# Patient Record
Sex: Female | Born: 1935 | Race: White | Hispanic: No | Smoking: Never smoker
Health system: Southern US, Community
[De-identification: ages and names within clinical notes are randomized; demographics above are authoritative.]

## PROBLEM LIST (undated history)

## (undated) DIAGNOSIS — I219 Acute myocardial infarction, unspecified: Secondary | ICD-10-CM

## (undated) DIAGNOSIS — I10 Essential (primary) hypertension: Secondary | ICD-10-CM

## (undated) DIAGNOSIS — E039 Hypothyroidism, unspecified: Secondary | ICD-10-CM

## (undated) DIAGNOSIS — I639 Cerebral infarction, unspecified: Secondary | ICD-10-CM

## (undated) DIAGNOSIS — E785 Hyperlipidemia, unspecified: Secondary | ICD-10-CM

## (undated) DIAGNOSIS — K449 Diaphragmatic hernia without obstruction or gangrene: Secondary | ICD-10-CM

## (undated) DIAGNOSIS — K219 Gastro-esophageal reflux disease without esophagitis: Secondary | ICD-10-CM

## (undated) HISTORY — PX: CHOLECYSTECTOMY: SHX55

---

## 2015-01-09 ENCOUNTER — Emergency Department (HOSPITAL_COMMUNITY): Payer: Medicare (Managed Care)

## 2015-01-09 ENCOUNTER — Emergency Department (HOSPITAL_COMMUNITY)
Admission: EM | Admit: 2015-01-09 | Discharge: 2015-01-09 | Disposition: A | Discharge status: Home / Self Care | Payer: Medicare (Managed Care) | Attending: Emergency Medicine | Admitting: Emergency Medicine

## 2015-01-09 DIAGNOSIS — R079 Chest pain, unspecified: Secondary | ICD-10-CM | POA: Diagnosis not present

## 2015-01-09 DIAGNOSIS — Z7982 Long term (current) use of aspirin: Secondary | ICD-10-CM | POA: Diagnosis not present

## 2015-01-09 DIAGNOSIS — Z79899 Other long term (current) drug therapy: Secondary | ICD-10-CM | POA: Diagnosis not present

## 2015-01-09 DIAGNOSIS — R531 Weakness: Secondary | ICD-10-CM

## 2015-01-09 DIAGNOSIS — E876 Hypokalemia: Secondary | ICD-10-CM | POA: Insufficient documentation

## 2015-01-09 LAB — URINE MICROSCOPIC-ADD ON

## 2015-01-09 LAB — CBC
HEMATOCRIT: 42 % (ref 36.0–46.0)
Hemoglobin: 13.7 g/dL (ref 12.0–15.0)
MCH: 31.6 pg (ref 26.0–34.0)
MCHC: 32.6 g/dL (ref 30.0–36.0)
MCV: 97 fL (ref 78.0–100.0)
PLATELETS: 221 10*3/uL (ref 150–400)
RBC: 4.33 MIL/uL (ref 3.87–5.11)
RDW: 12.7 % (ref 11.5–15.5)
WBC: 9.1 10*3/uL (ref 4.0–10.5)

## 2015-01-09 LAB — BASIC METABOLIC PANEL
Anion gap: 9 (ref 5–15)
BUN: 12 mg/dL (ref 6–20)
CHLORIDE: 102 mmol/L (ref 101–111)
CO2: 27 mmol/L (ref 22–32)
CREATININE: 0.78 mg/dL (ref 0.44–1.00)
Calcium: 8.8 mg/dL — ABNORMAL LOW (ref 8.9–10.3)
GFR calc non Af Amer: >60 mL/min (ref >60)
Glucose, Bld: 161 mg/dL — ABNORMAL HIGH (ref 65–99)
POTASSIUM: 3 mmol/L — AB (ref 3.5–5.1)
SODIUM: 138 mmol/L (ref 135–145)

## 2015-01-09 LAB — URINALYSIS, ROUTINE W REFLEX MICROSCOPIC
BILIRUBIN URINE: NEGATIVE
Glucose, UA: NEGATIVE mg/dL
Ketones, ur: NEGATIVE mg/dL
Leukocytes, UA: NEGATIVE
NITRITE: NEGATIVE
PROTEIN: NEGATIVE mg/dL
SPECIFIC GRAVITY, URINE: 1.011 (ref 1.005–1.030)
pH: 7 (ref 5.0–8.0)

## 2015-01-09 LAB — I-STAT TROPONIN, ED: Troponin i, poc: 0.01 ng/mL (ref 0.00–0.08)

## 2015-01-09 MED ORDER — POTASSIUM CHLORIDE CRYS ER 20 MEQ PO TBCR: 20.0000 meq | EXTENDED_RELEASE_TABLET | Freq: Every day | ORAL | Status: AC

## 2015-01-09 MED ORDER — POTASSIUM CHLORIDE CRYS ER 20 MEQ PO TBCR
40.0000 meq | EXTENDED_RELEASE_TABLET | Freq: Once | ORAL | Status: AC
  Administered 2015-01-09: 40 meq via ORAL
  Filled 2015-01-09: qty 2

## 2015-01-09 NOTE — ED Notes (Signed)
Pt. Stated, I was at my sons house from [facility] and I was not doing anything and I started having chest pain, left arm pain and some nausea.  This all happened a week ago,  Son brought her in today to make sure she is ok and able to fly back home.

## 2015-01-09 NOTE — ED Provider Notes (Signed)
CSN: [id number]     Arrival date & time 01/09/15  1428 History   First MD Initiated Contact with Patient 01/09/15 1604     Chief Complaint  Patient presents with  . Chest Pain  . Arm Pain  . Nausea     (Consider location/radiation/quality/duration/timing/severity/associated sxs/prior Treatment) HPI   Kristy Watts is a 81 y.o. female is here for evaluation of "heart attack", and fatigue. Her family members with her our concern that she had a heart attack 1 week ago because she had pain in her left chest that radiated to the left arm and left shoulder. The patient can't remember as she was doing at the time, but remembers the pain. Last 45 minutes then resolve spontaneously. She has had fatigue for the last several weeks, during which time she had been traveling both on a cruise, and now to her relatives here in [place]. She lives in [place]. She has a history of "heart failure" and believes that her ejection fraction is 40%. She states that she has never had a stress test or cardiac catheterization. She reports that she is taking her usual medications. She denies cough, shortness of breath, dyspnea on exertion, abdominal pain, back pain, dysuria, urinary frequency, or diarrhea. She has chronic intermittent constipation for which she takes medication. She plans on flying home, in 3 days. There are no other no modifying factors.   No past medical history on file. No past surgical history on file. No family history on file. Social History  Substance Use Topics  . Smoking status: Not on file  . Smokeless tobacco: Not on file  . Alcohol Use: Not on file   OB History    No data available     Review of Systems  All other systems reviewed and are negative.     Allergies  Review of patient's allergies indicates no known allergies.  Home Medications   Prior to Admission medications   Medication Sig Start Date End Date Taking? Authorizing Provider  aspirin EC 81 MG tablet  Take 81 mg by mouth daily.   Yes Historical Provider, MD  atorvastatin (LIPITOR) 10 MG tablet Take 10 mg by mouth daily.   Yes Historical Provider, MD  carvedilol (COREG) 3.125 MG tablet Take 3.125 mg by mouth 2 (two) times daily with a meal.   Yes Historical Provider, MD  dicyclomine (BENTYL) 10 MG capsule Take 10 mg by mouth 4 (four) times daily -  before meals and at bedtime.   Yes Historical Provider, MD  docusate sodium (COLACE) 100 MG capsule Take 100 mg by mouth daily as needed for mild constipation.   Yes Historical Provider, MD  furosemide (LASIX) 40 MG tablet Take 40 mg by mouth daily.   Yes Historical Provider, MD  isosorbide mononitrate (IMDUR) 60 MG 24 hr tablet Take 60 mg by mouth daily.   Yes Historical Provider, MD  levothyroxine (SYNTHROID, LEVOTHROID) 50 MCG tablet Take 50 mcg by mouth daily before breakfast.   Yes Historical Provider, MD  losartan (COZAAR) 25 MG tablet Take 25 mg by mouth daily.   Yes Historical Provider, MD  omeprazole (PRILOSEC) 20 MG capsule Take 20 mg by mouth daily.   Yes Historical Provider, MD  polyethylene glycol (MIRALAX / GLYCOLAX) packet Take 17 g by mouth daily as needed for mild constipation.   Yes Historical Provider, MD  prochlorperazine (COMPAZINE) 10 MG tablet Take 10 mg by mouth every 6 (six) hours as needed for nausea or vomiting.  Yes Historical Provider, MD  simethicone (MYLICON) 80 MG chewable tablet Chew 80 mg by mouth every 6 (six) hours as needed for flatulence.   Yes Historical Provider, MD  temazepam (RESTORIL) 15 MG capsule Take 15 mg by mouth at bedtime as needed for sleep.   Yes Historical Provider, MD  traMADol (ULTRAM) 50 MG tablet Take 50 mg by mouth every 6 (six) hours as needed for moderate pain.   Yes Historical Provider, MD  Valerian 500 MG CAPS Take 500 mg by mouth at bedtime.   Yes Historical Provider, MD  potassium chloride SA (K-DUR,KLOR-CON) 20 MEQ tablet Take 1 tablet (20 mEq total) by mouth daily. 01/09/15   Mancel Bale, MD   BP 124/65 mmHg  Pulse 58  Temp(Src) 98.2 F (36.8 C) (Oral)  Resp 11  Ht  (1.473 m)  Wt 142 lb 8 oz (64.638 kg)  BMI 29.79 kg/m2  SpO2 96% Physical Exam  Constitutional: She is oriented to person, place, and time. She appears well-developed. No distress.  Elderly, frail  HENT:  Head: Normocephalic and atraumatic.  Right Ear: External ear normal.  Left Ear: External ear normal.  Eyes: Conjunctivae and EOM are normal. Pupils are equal, round, and reactive to light.  Neck: Normal range of motion and phonation normal. Neck supple.  Cardiovascular: Normal rate, regular rhythm and normal heart sounds.   Pulmonary/Chest: Effort normal and breath sounds normal. She exhibits no bony tenderness.  Abdominal: Soft. There is no tenderness.  Musculoskeletal: Normal range of motion.  Neurological: She is alert and oriented to person, place, and time. No cranial nerve deficit or sensory deficit. She exhibits normal muscle tone. Coordination normal.  Skin: Skin is warm, dry and intact.  Psychiatric: She has a normal mood and affect. Her behavior is normal. Judgment and thought content normal.  Nursing note and vitals reviewed.   ED Course  Procedures (including critical care time)  Medications  potassium chloride SA (K-DUR,KLOR-CON) CR tablet 40 mEq (40 mEq Oral Given 01/09/15 1619)    Patient Vitals for the past 24 hrs:  BP Temp Temp src Pulse Resp SpO2 Height Weight  01/09/15 1730 124/65 mmHg - - (!) 58 11 96 % - -  01/09/15 1715 (!) 109/48 mmHg - - 61 21 93 % - -  01/09/15 1700 108/63 mmHg - - (!) 54 12 94 % - -  01/09/15 1645 108/72 mmHg - - 61 20 95 % - -  01/09/15 1447 104/71 mmHg 98.2 F (36.8 C) Oral 66 17 96 %  (1.473 m) 142 lb 8 oz (64.638 kg)    5:56 PM Reevaluation with update and discussion. After initial assessment and treatment, an updated evaluation reveals she remains comfortable, has no additional complaints. Findings discussed with patient and  family members, all questions were answered. [name] L    Labs Review Labs Reviewed  BASIC METABOLIC PANEL - Abnormal; Notable for the following:    Potassium 3.0 (*)    Glucose, Bld 161 (*)    Calcium 8.8 (*)    All other components within normal limits  URINALYSIS, ROUTINE W REFLEX MICROSCOPIC (NOT AT [facility]) - Abnormal; Notable for the following:    APPearance CLOUDY (*)    Hgb urine dipstick TRACE (*)    All other components within normal limits  URINE MICROSCOPIC-ADD ON - Abnormal; Notable for the following:    Squamous Epithelial / LPF 0-5 (*)    Bacteria, UA FEW (*)    Casts HYALINE  CASTS (*)    All other components within normal limits  URINE CULTURE  CBC  I-STAT TROPOININ, ED    Imaging Review Dg Chest 2 View  01/09/2015  CLINICAL DATA:  Chest pain EXAM: CHEST  2 VIEW COMPARISON:  None. FINDINGS: Large hiatal hernia, likely with intrathoracic stomach. There is no evidence of pneumonia or edema. No effusion or pneumothorax. Large lung volumes, potential COPD if smoking history. No definitive cardiomegaly when accounting for hernia. Negative aortic contours. IMPRESSION: 1. No acute finding. 2. Large hiatal hernia. Electronically Signed   By: Marnee Spring M.D.   On: 01/09/2015 16:14   I have personally reviewed and evaluated these images and lab results as part of my medical decision-making.   EKG Interpretation   Date/Time:  Saturday January 09 2015 14:35:54 EST Ventricular Rate:  65 PR Interval:  184 QRS Duration: 82 QT Interval:  384 QTC Calculation: 399 R Axis:   -59 Text Interpretation:  Normal sinus rhythm Possible Left atrial enlargement  Inferior infarct , age undetermined Abnormal ECG No old tracing to compare  Confirmed by FLOYD MD, DANIEL [id number]) on 01/09/2015 3:45:33 PM      MDM   Final diagnoses:  Nonspecific chest pain  Weakness  Hypokalemia    Nonspecific chest pain and weakness. Doubt recent MI, acute congestive heart failure, serious  bacterial infection, metabolic instability, or impending vascular collapse.  Nursing Notes Reviewed/ Care Coordinated Applicable Imaging Reviewed Interpretation of Laboratory Data incorporated into ED treatment  The patient appears reasonably screened and/or stabilized for discharge and I doubt any other medical condition or other [facility] requiring further screening, evaluation, or treatment in the ED at this time prior to discharge.  Plan: Home Medications- Kdur x 5 days; Home Treatments- rest; return here if the recommended treatment, does not improve the symptoms; Recommended follow up- PCP 3-4 days for check up and repeat potassium     Mancel Bale, MD 01/09/15 1758

## 2015-01-09 NOTE — ED Notes (Signed)
Pt. In the bathroom. 

## 2015-01-09 NOTE — Discharge Instructions (Signed)
Hypokalemia Hypokalemia means that the amount of potassium in the blood is lower than normal.Potassium is a chemical, called an electrolyte, that helps regulate the amount of fluid in the body. It also stimulates muscle contraction and helps nerves function properly.Most of the body's potassium is inside of cells, and only a very small amount is in the blood. Because the amount in the blood is so small, minor changes can be life-threatening. CAUSES  Antibiotics.  Diarrhea or vomiting.  Using laxatives too much, which can cause diarrhea.  Chronic kidney disease.  Water pills (diuretics).  Eating disorders (bulimia).  Low magnesium level.  Sweating a lot. SIGNS AND SYMPTOMS  Weakness.  Constipation.  Fatigue.  Muscle cramps.  Mental confusion.  Skipped heartbeats or irregular heartbeat (palpitations).  Tingling or numbness. DIAGNOSIS  Your health care provider can diagnose hypokalemia with blood tests. In addition to checking your potassium level, your health care provider may also check other lab tests. TREATMENT Hypokalemia can be treated with potassium supplements taken by mouth or adjustments in your current medicines. If your potassium level is very low, you may need to get potassium through a vein (IV) and be monitored in the hospital. A diet high in potassium is also helpful. Foods high in potassium are:  Nuts, such as peanuts and pistachios.  Seeds, such as sunflower seeds and pumpkin seeds.  Peas, lentils, and lima beans.  Whole grain and bran cereals and breads.  Fresh fruit and vegetables, such as apricots, avocado, bananas, cantaloupe, kiwi, oranges, tomatoes, asparagus, and potatoes.  Orange and tomato juices.  Red meats.  Fruit yogurt. HOME CARE INSTRUCTIONS  Take all medicines as prescribed by your health care provider.  Maintain a healthy diet by including nutritious food, such as fruits, vegetables, nuts, whole grains, and lean meats.  If  you are taking a laxative, be sure to follow the directions on the label. SEEK MEDICAL CARE IF:  Your weakness gets worse.  You feel your heart pounding or racing.  You are vomiting or having diarrhea.  You are diabetic and having trouble keeping your blood glucose in the normal range. SEEK IMMEDIATE MEDICAL CARE IF:  You have chest pain, shortness of breath, or dizziness.  You are vomiting or having diarrhea for more than 2 days.  You faint. MAKE SURE YOU:   Understand these instructions.  Will watch your condition.  Will get help right away if you are not doing well or get worse.   This information is not intended to replace advice given to you by your health care provider. Make sure you discuss any questions you have with your health care provider.   Document Released: 01/23/2005 Document Revised: 02/13/2014 Document Reviewed: 07/26/2012 Elsevier Interactive Patient Education 2016 [employer].  Fatigue Fatigue is feeling tired all of the time, a lack of energy, or a lack of motivation. Occasional or mild fatigue is often a normal response to activity or life in general. However, long-lasting (chronic) or extreme fatigue may indicate an underlying medical condition. HOME CARE INSTRUCTIONS  Watch your fatigue for any changes. The following actions may help to lessen any discomfort you are feeling:  Talk to your health care provider about how much sleep you need each night. Try to get the required amount every night.  Take medicines only as directed by your health care provider.  Eat a healthy and nutritious diet. Ask your health care provider if you need help changing your diet.  Drink enough fluid to keep your urine  clear or pale yellow.  Practice ways of relaxing, such as yoga, meditation, massage therapy, or acupuncture.  Exercise regularly.   Change situations that cause you stress. Try to keep your work and personal routine reasonable.  Do not abuse illegal  drugs.  Limit alcohol intake to no more than 1 drink per day for nonpregnant women and 2 drinks per day for men. One drink equals 12 ounces of beer, 5 ounces of wine, or 1 ounces of hard liquor.  Take a multivitamin, if directed by your health care provider. SEEK MEDICAL CARE IF:   Your fatigue does not get better.  You have a fever.   You have unintentional weight loss or gain.  You have headaches.   You have difficulty:   Falling asleep.  Sleeping throughout the night.  You feel angry, guilty, anxious, or sad.   You are unable to have a bowel movement (constipation).   You skin is dry.   Your legs or another part of your body is swollen.  SEEK IMMEDIATE MEDICAL CARE IF:   You feel confused.   Your vision is blurry.  You feel faint or pass out.   You have a severe headache.   You have severe abdominal, pelvic, or back pain.   You have chest pain, shortness of breath, or an irregular or fast heartbeat.   You are unable to urinate or you urinate less than normal.   You develop abnormal bleeding, such as bleeding from the rectum, vagina, nose, lungs, or nipples.  You vomit blood.   You have thoughts about harming yourself or committing suicide.   You are worried that you might harm someone else.    This information is not intended to replace advice given to you by your health care provider. Make sure you discuss any questions you have with your health care provider.   Document Released: 11/20/2006 Document Revised: 02/13/2014 Document Reviewed: 05/27/2013 Elsevier Interactive Patient Education 2016 [employer]  Nonspecific Chest Pain  Chest pain can be caused by many different conditions. There is always a chance that your pain could be related to something serious, such as a heart attack or a blood clot in your lungs. Chest pain can also be caused by conditions that are not life-threatening. If you have chest pain, it is very important  to follow up with your health care provider. CAUSES  Chest pain can be caused by:  Heartburn.  Pneumonia or bronchitis.  Anxiety or stress.  Inflammation around your heart (pericarditis) or lung (pleuritis or pleurisy).  A blood clot in your lung.  A collapsed lung (pneumothorax). It can develop suddenly on its own (spontaneous pneumothorax) or from trauma to the chest.  Shingles infection (varicella-zoster virus).  Heart attack.  Damage to the bones, muscles, and cartilage that make up your chest wall. This can include:  Bruised bones due to injury.  Strained muscles or cartilage due to frequent or repeated coughing or overwork.  Fracture to one or more ribs.  Sore cartilage due to inflammation (costochondritis). RISK FACTORS  Risk factors for chest pain may include:  Activities that increase your risk for trauma or injury to your chest.  Respiratory infections or conditions that cause frequent coughing.  Medical conditions or overeating that can cause heartburn.  Heart disease or family history of heart disease.  Conditions or health behaviors that increase your risk of developing a blood clot.  Having had chicken pox (varicella zoster). SIGNS AND SYMPTOMS Chest pain can feel like:  Burning or tingling on the surface of your chest or deep in your chest.  Crushing, pressure, aching, or squeezing pain.  Dull or sharp pain that is worse when you move, cough, or take a deep breath.  Pain that is also felt in your back, neck, shoulder, or arm, or pain that spreads to any of these areas. Your chest pain may come and go, or it may stay constant. DIAGNOSIS Lab tests or other studies may be needed to find the cause of your pain. Your health care provider may have you take a test called an ambulatory ECG (electrocardiogram). An ECG records your heartbeat patterns at the time the test is performed. You may also have other tests, such as:  Transthoracic echocardiogram  (TTE). During echocardiography, sound waves are used to create a picture of all of the heart structures and to look at how blood flows through your heart.  Transesophageal echocardiogram (TEE).This is a more advanced imaging test that obtains images from inside your body. It allows your health care provider to see your heart in finer detail.  Cardiac monitoring. This allows your health care provider to monitor your heart rate and rhythm in real time.  Holter monitor. This is a portable device that records your heartbeat and can help to diagnose abnormal heartbeats. It allows your health care provider to track your heart activity for several days, if needed.  Stress tests. These can be done through exercise or by taking medicine that makes your heart beat more quickly.  Blood tests.  Imaging tests. TREATMENT  Your treatment depends on what is causing your chest pain. Treatment may include:  Medicines. These may include:  Acid blockers for heartburn.  Anti-inflammatory medicine.  Pain medicine for inflammatory conditions.  Antibiotic medicine, if an infection is present.  Medicines to dissolve blood clots.  Medicines to treat coronary artery disease.  Supportive care for conditions that do not require medicines. This may include:  Resting.  Applying heat or cold packs to injured areas.  Limiting activities until pain decreases. HOME CARE INSTRUCTIONS  If you were prescribed an antibiotic medicine, finish it all even if you start to feel better.  Avoid any activities that bring on chest pain.  Do not use any tobacco products, including cigarettes, chewing tobacco, or electronic cigarettes. If you need help quitting, ask your health care provider.  Do not drink alcohol.  Take medicines only as directed by your health care provider.  Keep all follow-up visits as directed by your health care provider. This is important. This includes any further testing if your chest pain  does not go away.  If heartburn is the cause for your chest pain, you may be told to keep your head raised (elevated) while sleeping. This reduces the chance that acid will go from your stomach into your esophagus.  Make lifestyle changes as directed by your health care provider. These may include:  Getting regular exercise. Ask your health care provider to suggest some activities that are safe for you.  Eating a heart-healthy diet. A registered dietitian can help you to learn healthy eating options.  Maintaining a healthy weight.  Managing diabetes, if necessary.  Reducing stress. SEEK MEDICAL CARE IF:  Your chest pain does not go away after treatment.  You have a rash with blisters on your chest.  You have a fever. SEEK IMMEDIATE MEDICAL CARE IF:   Your chest pain is worse.  You have an increasing cough, or you cough up blood.  You  have severe abdominal pain.  You have severe weakness.  You faint.  You have chills.  You have sudden, unexplained chest discomfort.  You have sudden, unexplained discomfort in your arms, back, neck, or jaw.  You have shortness of breath at any time.  You suddenly start to sweat, or your skin gets clammy.  You feel nauseous or you vomit.  You suddenly feel light-headed or dizzy.  Your heart begins to beat quickly, or it feels like it is skipping beats. These symptoms may represent a serious problem that is an emergency. Do not wait to see if the symptoms will go away. Get medical help right away. Call your local emergency services (911 in the [place] Do not drive yourself to the hospital.   This information is not intended to replace advice given to you by your health care provider. Make sure you discuss any questions you have with your health care provider.   Document Released: 11/02/2004 Document Revised: 02/13/2014 Document Reviewed: 08/29/2013 Elsevier Interactive Patient Education [employer]

## 2015-01-11 LAB — URINE CULTURE: Special Requests: NORMAL

## 2015-05-24 ENCOUNTER — Inpatient Hospital Stay (HOSPITAL_COMMUNITY)
Admission: EM | Admit: 2015-05-24 | Discharge: 2015-05-26 | DRG: 689 | Disposition: A | Discharge status: Home / Self Care | Payer: Medicare (Managed Care) | Attending: Internal Medicine | Admitting: Internal Medicine

## 2015-05-24 ENCOUNTER — Emergency Department (HOSPITAL_COMMUNITY): Payer: Medicare (Managed Care)

## 2015-05-24 ENCOUNTER — Encounter (HOSPITAL_COMMUNITY): Payer: Self-pay | Admitting: Radiology

## 2015-05-24 DIAGNOSIS — I219 Acute myocardial infarction, unspecified: Secondary | ICD-10-CM | POA: Diagnosis present

## 2015-05-24 DIAGNOSIS — R2990 Unspecified symptoms and signs involving the nervous system: Secondary | ICD-10-CM

## 2015-05-24 DIAGNOSIS — E785 Hyperlipidemia, unspecified: Secondary | ICD-10-CM | POA: Diagnosis present

## 2015-05-24 DIAGNOSIS — G92 Toxic encephalopathy: Secondary | ICD-10-CM | POA: Diagnosis present

## 2015-05-24 DIAGNOSIS — R42 Dizziness and giddiness: Secondary | ICD-10-CM | POA: Diagnosis not present

## 2015-05-24 DIAGNOSIS — R4781 Slurred speech: Secondary | ICD-10-CM | POA: Diagnosis not present

## 2015-05-24 DIAGNOSIS — I252 Old myocardial infarction: Secondary | ICD-10-CM

## 2015-05-24 DIAGNOSIS — N390 Urinary tract infection, site not specified: Principal | ICD-10-CM | POA: Diagnosis present

## 2015-05-24 DIAGNOSIS — R531 Weakness: Secondary | ICD-10-CM

## 2015-05-24 DIAGNOSIS — E039 Hypothyroidism, unspecified: Secondary | ICD-10-CM | POA: Diagnosis present

## 2015-05-24 DIAGNOSIS — K219 Gastro-esophageal reflux disease without esophagitis: Secondary | ICD-10-CM | POA: Diagnosis present

## 2015-05-24 DIAGNOSIS — I2510 Atherosclerotic heart disease of native coronary artery without angina pectoris: Secondary | ICD-10-CM | POA: Diagnosis present

## 2015-05-24 DIAGNOSIS — R410 Disorientation, unspecified: Secondary | ICD-10-CM

## 2015-05-24 DIAGNOSIS — R5383 Other fatigue: Secondary | ICD-10-CM | POA: Diagnosis not present

## 2015-05-24 DIAGNOSIS — G9340 Encephalopathy, unspecified: Secondary | ICD-10-CM

## 2015-05-24 DIAGNOSIS — Z23 Encounter for immunization: Secondary | ICD-10-CM

## 2015-05-24 DIAGNOSIS — I639 Cerebral infarction, unspecified: Secondary | ICD-10-CM | POA: Diagnosis present

## 2015-05-24 DIAGNOSIS — Z7982 Long term (current) use of aspirin: Secondary | ICD-10-CM

## 2015-05-24 DIAGNOSIS — I10 Essential (primary) hypertension: Secondary | ICD-10-CM | POA: Diagnosis present

## 2015-05-24 DIAGNOSIS — J322 Chronic ethmoidal sinusitis: Secondary | ICD-10-CM | POA: Diagnosis present

## 2015-05-24 HISTORY — DX: Hypothyroidism, unspecified: E03.9

## 2015-05-24 HISTORY — DX: Diaphragmatic hernia without obstruction or gangrene: K44.9

## 2015-05-24 HISTORY — DX: Essential (primary) hypertension: I10

## 2015-05-24 HISTORY — DX: Hyperlipidemia, unspecified: E78.5

## 2015-05-24 HISTORY — DX: Cerebral infarction, unspecified: I63.9

## 2015-05-24 HISTORY — DX: Gastro-esophageal reflux disease without esophagitis: K21.9

## 2015-05-24 HISTORY — DX: Acute myocardial infarction, unspecified: I21.9

## 2015-05-24 LAB — COMPREHENSIVE METABOLIC PANEL
ALK PHOS: 67 U/L (ref 38–126)
ALT: 11 U/L — AB (ref 14–54)
AST: 20 U/L (ref 15–41)
Albumin: 3.3 g/dL — ABNORMAL LOW (ref 3.5–5.0)
Anion gap: 12 (ref 5–15)
BUN: 14 mg/dL (ref 6–20)
CALCIUM: 8.6 mg/dL — AB (ref 8.9–10.3)
CHLORIDE: 106 mmol/L (ref 101–111)
CO2: 23 mmol/L (ref 22–32)
CREATININE: 0.9 mg/dL (ref 0.44–1.00)
GFR, EST NON AFRICAN AMERICAN: 56 mL/min — AB (ref >60)
Glucose, Bld: 98 mg/dL (ref 65–99)
Potassium: 3.8 mmol/L (ref 3.5–5.1)
SODIUM: 141 mmol/L (ref 135–145)
Total Bilirubin: 1 mg/dL (ref 0.3–1.2)
Total Protein: 6.6 g/dL (ref 6.5–8.1)

## 2015-05-24 LAB — URINALYSIS, ROUTINE W REFLEX MICROSCOPIC
BILIRUBIN URINE: NEGATIVE
Glucose, UA: NEGATIVE mg/dL
KETONES UR: 15 mg/dL — AB
Nitrite: NEGATIVE
PROTEIN: NEGATIVE mg/dL
Specific Gravity, Urine: 1.015 (ref 1.005–1.030)
pH: 7 (ref 5.0–8.0)

## 2015-05-24 LAB — DIFFERENTIAL
BASOS ABS: 0 10*3/uL (ref 0.0–0.1)
BASOS PCT: 0 %
Eosinophils Absolute: 0.1 10*3/uL (ref 0.0–0.7)
Eosinophils Relative: 2 %
LYMPHS PCT: 26 %
Lymphs Abs: 1.2 10*3/uL (ref 0.7–4.0)
MONO ABS: 0.9 10*3/uL (ref 0.1–1.0)
MONOS PCT: 19 %
NEUTROS ABS: 2.4 10*3/uL (ref 1.7–7.7)
Neutrophils Relative %: 53 %

## 2015-05-24 LAB — I-STAT CHEM 8, ED
BUN: 17 mg/dL (ref 6–20)
CALCIUM ION: 1.07 mmol/L — AB (ref 1.13–1.30)
CHLORIDE: 103 mmol/L (ref 101–111)
CREATININE: 0.7 mg/dL (ref 0.44–1.00)
GLUCOSE: 95 mg/dL (ref 65–99)
HCT: 41 % (ref 36.0–46.0)
Hemoglobin: 13.9 g/dL (ref 12.0–15.0)
POTASSIUM: 3.7 mmol/L (ref 3.5–5.1)
Sodium: 142 mmol/L (ref 135–145)
TCO2: 25 mmol/L (ref 0–100)

## 2015-05-24 LAB — CBC
HEMATOCRIT: 38.6 % (ref 36.0–46.0)
HEMOGLOBIN: 12.1 g/dL (ref 12.0–15.0)
MCH: 30.9 pg (ref 26.0–34.0)
MCHC: 31.3 g/dL (ref 30.0–36.0)
MCV: 98.7 fL (ref 78.0–100.0)
Platelets: 190 10*3/uL (ref 150–400)
RBC: 3.91 MIL/uL (ref 3.87–5.11)
RDW: 13.1 % (ref 11.5–15.5)
WBC: 4.6 10*3/uL (ref 4.0–10.5)

## 2015-05-24 LAB — RAPID URINE DRUG SCREEN, HOSP PERFORMED
AMPHETAMINES: NOT DETECTED
BARBITURATES: NOT DETECTED
BENZODIAZEPINES: NOT DETECTED
Cocaine: NOT DETECTED
Opiates: POSITIVE — AB
Tetrahydrocannabinol: NOT DETECTED

## 2015-05-24 LAB — I-STAT VENOUS BLOOD GAS, ED
ACID-BASE EXCESS: 1 mmol/L (ref 0.0–2.0)
Bicarbonate: 27.2 mEq/L — ABNORMAL HIGH (ref 20.0–24.0)
O2 Saturation: 73 %
PH VEN: 7.361 — AB (ref 7.250–7.300)
TCO2: 29 mmol/L (ref 0–100)
pCO2, Ven: 48.1 mmHg (ref 45.0–50.0)
pO2, Ven: 40 mmHg (ref 31.0–45.0)

## 2015-05-24 LAB — I-STAT TROPONIN, ED: Troponin i, poc: 0 ng/mL (ref 0.00–0.08)

## 2015-05-24 LAB — URINE MICROSCOPIC-ADD ON

## 2015-05-24 LAB — PROTIME-INR
INR: 1.14 (ref 0.00–1.49)
Prothrombin Time: 14.8 seconds (ref 11.6–15.2)

## 2015-05-24 LAB — ETHANOL

## 2015-05-24 LAB — APTT: APTT: 36 s (ref 24–37)

## 2015-05-24 LAB — CK: CK TOTAL: 59 U/L (ref 38–234)

## 2015-05-24 LAB — TROPONIN I

## 2015-05-24 MED ORDER — DEXTROSE 5 % IV SOLN: 1.0000 g | INTRAVENOUS | Status: DC

## 2015-05-24 MED ORDER — GABAPENTIN 100 MG PO CAPS
100.0000 mg | ORAL_CAPSULE | Freq: Every day | ORAL | Status: DC
  Administered 2015-05-25 – 2015-05-26 (×2): 100 mg via ORAL
  Filled 2015-05-24 (×2): qty 1

## 2015-05-24 MED ORDER — VALERIAN 500 MG PO CAPS: 500.0000 mg | ORAL_CAPSULE | Freq: Every day | ORAL | Status: DC

## 2015-05-24 MED ORDER — TRAZODONE HCL 50 MG PO TABS
50.0000 mg | ORAL_TABLET | Freq: Every day | ORAL | Status: DC
  Administered 2015-05-24 – 2015-05-25 (×2): 50 mg via ORAL
  Filled 2015-05-24 (×2): qty 1

## 2015-05-24 MED ORDER — GABAPENTIN 100 MG PO CAPS
200.0000 mg | ORAL_CAPSULE | Freq: Every day | ORAL | Status: DC
  Administered 2015-05-24 – 2015-05-25 (×2): 200 mg via ORAL
  Filled 2015-05-24 (×2): qty 2

## 2015-05-24 MED ORDER — SODIUM CHLORIDE 0.9 % IV BOLUS (SEPSIS)
1000.0000 mL | Freq: Once | INTRAVENOUS | Status: AC
Start: 2015-05-24 — End: 2015-05-24
  Administered 2015-05-24: 1000 mL via INTRAVENOUS

## 2015-05-24 MED ORDER — ACETAMINOPHEN 325 MG PO TABS
650.0000 mg | ORAL_TABLET | Freq: Four times a day (QID) | ORAL | Status: DC | PRN
  Administered 2015-05-25 – 2015-05-26 (×5): 650 mg via ORAL
  Filled 2015-05-24 (×5): qty 2

## 2015-05-24 MED ORDER — ISOSORBIDE MONONITRATE ER 60 MG PO TB24
60.0000 mg | ORAL_TABLET | Freq: Every day | ORAL | Status: DC
  Administered 2015-05-25 – 2015-05-26 (×2): 60 mg via ORAL
  Filled 2015-05-24 (×2): qty 1

## 2015-05-24 MED ORDER — HYDRALAZINE HCL 20 MG/ML IJ SOLN: 5.0000 mg | INTRAMUSCULAR | Status: DC | PRN

## 2015-05-24 MED ORDER — SODIUM CHLORIDE 0.9 % IV SOLN
INTRAVENOUS | Status: DC
  Administered 2015-05-24 – 2015-05-25 (×3): via INTRAVENOUS

## 2015-05-24 MED ORDER — SODIUM CHLORIDE 0.9 % IV BOLUS (SEPSIS)
1000.0000 mL | Freq: Once | INTRAVENOUS | Status: AC
  Administered 2015-05-24: 1000 mL via INTRAVENOUS

## 2015-05-24 MED ORDER — ISOSORBIDE MONONITRATE ER 60 MG PO TB24: 60.0000 mg | ORAL_TABLET | Freq: Every day | ORAL | Status: DC

## 2015-05-24 MED ORDER — ONDANSETRON HCL 4 MG/2ML IJ SOLN: 4.0000 mg | Freq: Three times a day (TID) | INTRAMUSCULAR | Status: DC | PRN

## 2015-05-24 MED ORDER — SODIUM CHLORIDE 0.9% FLUSH
3.0000 mL | Freq: Two times a day (BID) | INTRAVENOUS | Status: DC
  Administered 2015-05-25: 3 mL via INTRAVENOUS

## 2015-05-24 MED ORDER — TRAZODONE HCL 50 MG PO TABS: 50.0000 mg | ORAL_TABLET | Freq: Every day | ORAL | Status: DC

## 2015-05-24 MED ORDER — DICYCLOMINE HCL 10 MG PO CAPS
10.0000 mg | ORAL_CAPSULE | Freq: Three times a day (TID) | ORAL | Status: DC
  Administered 2015-05-24 – 2015-05-26 (×7): 10 mg via ORAL
  Filled 2015-05-24 (×7): qty 1

## 2015-05-24 MED ORDER — GABAPENTIN 100 MG PO CAPS: 100.0000 mg | ORAL_CAPSULE | ORAL | Status: DC

## 2015-05-24 MED ORDER — PANTOPRAZOLE SODIUM 40 MG PO TBEC
40.0000 mg | DELAYED_RELEASE_TABLET | Freq: Every day | ORAL | Status: DC
  Administered 2015-05-24 – 2015-05-26 (×3): 40 mg via ORAL
  Filled 2015-05-24 (×3): qty 1

## 2015-05-24 MED ORDER — LOSARTAN POTASSIUM 25 MG PO TABS
25.0000 mg | ORAL_TABLET | Freq: Every day | ORAL | Status: DC
  Administered 2015-05-25 – 2015-05-26 (×2): 25 mg via ORAL
  Filled 2015-05-24 (×2): qty 1

## 2015-05-24 MED ORDER — ATORVASTATIN CALCIUM 10 MG PO TABS: 10.0000 mg | ORAL_TABLET | Freq: Every day | ORAL | Status: DC

## 2015-05-24 MED ORDER — ACETAMINOPHEN 650 MG RE SUPP: 650.0000 mg | Freq: Four times a day (QID) | RECTAL | Status: DC | PRN

## 2015-05-24 MED ORDER — CEFTRIAXONE SODIUM 1 G IJ SOLR
1.0000 g | Freq: Once | INTRAMUSCULAR | Status: AC
  Administered 2015-05-24: 1 g via INTRAVENOUS
  Filled 2015-05-24: qty 10

## 2015-05-24 MED ORDER — DOCUSATE SODIUM 100 MG PO CAPS
100.0000 mg | ORAL_CAPSULE | Freq: Two times a day (BID) | ORAL | Status: DC
  Administered 2015-05-24 – 2015-05-26 (×4): 100 mg via ORAL
  Filled 2015-05-24 (×4): qty 1

## 2015-05-24 MED ORDER — LEVOTHYROXINE SODIUM 50 MCG PO TABS
50.0000 ug | ORAL_TABLET | Freq: Every day | ORAL | Status: DC
  Administered 2015-05-25 – 2015-05-26 (×2): 50 ug via ORAL
  Filled 2015-05-24 (×2): qty 1

## 2015-05-24 MED ORDER — DEXTROSE 5 % IV SOLN
1.0000 g | INTRAVENOUS | Status: DC
  Administered 2015-05-25: 1 g via INTRAVENOUS
  Filled 2015-05-24 (×2): qty 10

## 2015-05-24 MED ORDER — ENOXAPARIN SODIUM 40 MG/0.4ML ~~LOC~~ SOLN
40.0000 mg | SUBCUTANEOUS | Status: DC
  Administered 2015-05-25 – 2015-05-26 (×2): 40 mg via SUBCUTANEOUS
  Filled 2015-05-24 (×2): qty 0.4

## 2015-05-24 MED ORDER — ASPIRIN 300 MG RE SUPP
300.0000 mg | Freq: Once | RECTAL | Status: AC
  Administered 2015-05-24: 300 mg via RECTAL
  Filled 2015-05-24: qty 1

## 2015-05-24 MED ORDER — ASPIRIN EC 81 MG PO TBEC
81.0000 mg | DELAYED_RELEASE_TABLET | Freq: Every day | ORAL | Status: DC
  Administered 2015-05-25 – 2015-05-26 (×2): 81 mg via ORAL
  Filled 2015-05-24 (×2): qty 1

## 2015-05-24 MED ORDER — ATORVASTATIN CALCIUM 10 MG PO TABS
10.0000 mg | ORAL_TABLET | Freq: Every day | ORAL | Status: DC
  Administered 2015-05-24 – 2015-05-25 (×2): 10 mg via ORAL
  Filled 2015-05-24 (×2): qty 1

## 2015-05-24 MED ORDER — POLYETHYLENE GLYCOL 3350 17 G PO PACK: 17.0000 g | PACK | Freq: Every day | ORAL | Status: DC | PRN

## 2015-05-24 MED ORDER — PNEUMOCOCCAL VAC POLYVALENT 25 MCG/0.5ML IJ INJ
0.5000 mL | INJECTION | INTRAMUSCULAR | Status: DC
  Filled 2015-05-24: qty 0.5

## 2015-05-24 MED ORDER — CARVEDILOL 3.125 MG PO TABS
3.1250 mg | ORAL_TABLET | Freq: Two times a day (BID) | ORAL | Status: DC
  Administered 2015-05-25 – 2015-05-26 (×2): 3.125 mg via ORAL
  Filled 2015-05-24 (×3): qty 1

## 2015-05-24 MED ORDER — ONDANSETRON HCL 4 MG/2ML IJ SOLN
4.0000 mg | Freq: Once | INTRAMUSCULAR | Status: AC
  Administered 2015-05-24: 4 mg via INTRAVENOUS
  Filled 2015-05-24: qty 2

## 2015-05-24 NOTE — ED Provider Notes (Signed)
CSN: [id number]     Arrival date & time 05/24/15  1716 History   First MD Initiated Contact with Patient 05/24/15 1735     Chief Complaint  Patient presents with  . Code Stroke    @ (Consider location/radiation/quality/duration/timing/severity/associated sxs/prior Treatment) Patient is a 82 y.o. female presenting with general illness. The history is provided by the patient and a relative.  Illness Associated symptoms: fatigue   Associated symptoms: no abdominal pain, no chest pain, no congestion, no cough, no diarrhea, no fever, no headaches, no nausea, no rash, no rhinorrhea, no shortness of breath and no wheezing     82 yo F with PMHx of CVA, MI who presents with AMS. Per report, pt awoke this morning and was more confused than baseline. She was minimally interactive throughout the day. Approximately 2 hours ago, she became acutely more confused with slurred speech, expressive aphasia, and disorientation. Family called EMS. On EMS arrival, CBG 108, BP >90 systolic. Pt denies complaints on arrival with exception of mild lightheadedness on standing. Denies any focal weakness or numbness at this time. Denies recent falls or head trauma. Code stroke activated prior to arrival by EMS, so pt taken immediately to CT scanner with Neurology.  Past Medical History  Diagnosis Date  . MI (myocardial infarction) (HCC)   . Stroke (HCC)   . Hernia, hiatal   . Essential hypertension   . HLD (hyperlipidemia)   . GERD (gastroesophageal reflux disease)   . Hypothyroidism    Past Surgical History  Procedure Laterality Date  . Cholecystectomy     Family History  Problem Relation Age of Onset  . Intracerebral hemorrhage Mother   . Heart attack Father   . Leukemia Brother    Social History  Substance Use Topics  . Smoking status: Never Smoker   . Smokeless tobacco: None  . Alcohol Use: 2.4 oz/week    4 Glasses of wine per week   OB History    No data available     Review of  Systems  Constitutional: Positive for fatigue. Negative for fever and chills.  HENT: Negative for congestion and rhinorrhea.   Eyes: Negative for visual disturbance.  Respiratory: Negative for cough, shortness of breath and wheezing.   Cardiovascular: Negative for chest pain and leg swelling.  Gastrointestinal: Negative for nausea, abdominal pain and diarrhea.  Genitourinary: Positive for frequency. Negative for dysuria.  Musculoskeletal: Negative for neck pain.  Skin: Negative for rash.  Neurological: Negative for syncope and headaches.  Psychiatric/Behavioral: Positive for confusion.      Allergies  Review of patient's allergies indicates no known allergies.  Home Medications   Prior to Admission medications   Medication Sig Start Date End Date Taking? Authorizing Provider  aspirin EC 81 MG tablet Take 81 mg by mouth daily.   Yes Historical Provider, MD  atorvastatin (LIPITOR) 10 MG tablet Take 10 mg by mouth daily.   Yes Historical Provider, MD  carvedilol (COREG) 3.125 MG tablet Take 3.125 mg by mouth 2 (two) times daily with a meal.   Yes Historical Provider, MD  dicyclomine (BENTYL) 10 MG capsule Take 10 mg by mouth 4 (four) times daily -  before meals and at bedtime.   Yes Historical Provider, MD  docusate sodium (COLACE) 100 MG capsule Take 100 mg by mouth 2 (two) times daily.    Yes Historical Provider, MD  furosemide (LASIX) 40 MG tablet Take 40 mg by mouth daily as needed for fluid.    Yes Historical  Provider, MD  gabapentin (NEURONTIN) 100 MG capsule Take 100 mg by mouth See admin instructions. Take one tablet in the morning and 2 tablets in the evening   Yes Historical Provider, MD  isosorbide mononitrate (IMDUR) 60 MG 24 hr tablet Take 60 mg by mouth daily.   Yes Historical Provider, MD  levothyroxine (SYNTHROID, LEVOTHROID) 50 MCG tablet Take 50 mcg by mouth daily before breakfast.   Yes Historical Provider, MD  losartan (COZAAR) 25 MG tablet Take 25 mg by mouth daily.    Yes Historical Provider, MD  omeprazole (PRILOSEC) 20 MG capsule Take 20 mg by mouth daily.   Yes Historical Provider, MD  polyethylene glycol (MIRALAX / GLYCOLAX) packet Take 17 g by mouth daily as needed for mild constipation.   Yes Historical Provider, MD  prochlorperazine (COMPAZINE) 10 MG tablet Take 10 mg by mouth every 6 (six) hours as needed for nausea or vomiting.   Yes Historical Provider, MD  traZODone (DESYREL) 50 MG tablet Take 50 mg by mouth daily.   Yes Historical Provider, MD  Valerian 500 MG CAPS Take 500 mg by mouth at bedtime.   Yes Historical Provider, MD  potassium chloride SA (K-DUR,KLOR-CON) 20 MEQ tablet Take 1 tablet (20 mEq total) by mouth daily. Patient not taking: Reported on 05/24/2015 01/09/15   Mancel Bale, MD   BP 91/57 mmHg  Pulse 61  Temp(Src) 98.2 F (36.8 C) (Oral)  Resp 18  Ht 4\' 11"  (1.499 m)  Wt 66.679 kg  BMI 29.67 kg/m2  SpO2 94% Physical Exam  Constitutional: She appears well-developed and well-nourished. No distress.  HENT:  Head: Normocephalic.  Mouth/Throat: No oropharyngeal exudate.  Eyes: Pupils are equal, round, and reactive to light.  Neck: Normal range of motion. Neck supple.  Cardiovascular: Normal rate, regular rhythm, normal heart sounds and intact distal pulses.  Exam reveals no friction rub.   No murmur heard. Pulmonary/Chest: Effort normal and breath sounds normal. No respiratory distress. She has no wheezes. She has no rales.  Abdominal: Soft. She exhibits no distension. There is no tenderness.  Musculoskeletal: She exhibits no edema.  Neurological: She is alert.  Skin: Skin is warm.  Nursing note and vitals reviewed.   Neurological Exam:  - Mental Status: Alert and oriented to person, place, but not time. Attention and concentration normal. Speech clear. Recent memory is intact. - Cranial Nerves: Visual fields intact to confrontation in all quadrants bilaterally. EOMI and PERRLA. No nystagmus noted. Facial sensation  intact at forehead, maxillary cheek, and chin/mandible bilaterally. No weakness of masticatory muscles. No facial asymmetry or weakness. Hearing grossly normal to finer rub. Uvula is midline, and palate elevates symmetrically. Normal SCM and trapezius strength. Tongue midline without fasciculations - Motor: Muscle strength 5/5 in proximal and distal UE and LE bilaterally. No pronator drift. Muscle tone normal. - Reflexes: 2+ and symmetrical in all four extremities.  - Sensation: Intact to light touch and pinprick in upper and lower extremities distally bilaterally.  - Gait: Deferred - Coordination: Normal FTN and HTS bilaterally.    ED Course  Procedures (including critical care time) Labs Review Labs Reviewed  COMPREHENSIVE METABOLIC PANEL - Abnormal; Notable for the following:    Calcium 8.6 (*)    Albumin 3.3 (*)    ALT 11 (*)    GFR calc non Af Amer 56 (*)    All other components within normal limits  URINE RAPID DRUG SCREEN, HOSP PERFORMED - Abnormal; Notable for the following:    Opiates  POSITIVE (*)    All other components within normal limits  URINALYSIS, ROUTINE W REFLEX MICROSCOPIC (NOT AT [facility]) - Abnormal; Notable for the following:    APPearance CLOUDY (*)    Hgb urine dipstick TRACE (*)    Ketones, ur 15 (*)    Leukocytes, UA LARGE (*)    All other components within normal limits  URINE MICROSCOPIC-ADD ON - Abnormal; Notable for the following:    Squamous Epithelial / LPF 6-30 (*)    Bacteria, UA FEW (*)    All other components within normal limits  I-STAT CHEM 8, ED - Abnormal; Notable for the following:    Calcium, Ion 1.07 (*)    All other components within normal limits  I-STAT VENOUS BLOOD GAS, ED - Abnormal; Notable for the following:    pH, Ven 7.361 (*)    Bicarbonate 27.2 (*)    All other components within normal limits  URINE CULTURE  CULTURE, BLOOD (ROUTINE X 2)  CULTURE, BLOOD (ROUTINE X 2)  ETHANOL  PROTIME-INR  APTT  CBC  DIFFERENTIAL  CK   TROPONIN I  TSH  HEMOGLOBIN A1C  LIPID PANEL  I-STAT TROPOININ, ED    Imaging Review Ct Head Wo Contrast  05/24/2015  ADDENDUM REPORT: 05/24/2015 17:42 ADDENDUM: The original report was by Dr. Gaylyn Rong. The following addendum is by Dr. Gaylyn Rong: These results were called by telephone at the time of interpretation on 05/24/2015 at 5:40 pm to Dr. Lavon Paganini, who verbally acknowledged these results. Electronically Signed   By: Gaylyn Rong M.D.   On: 05/24/2015 17:42  05/24/2015  CLINICAL DATA:  Right-sided weakness and facial weakness. Slurred speech. EXAM: CT HEAD WITHOUT CONTRAST TECHNIQUE: Contiguous axial images were obtained from the base of the skull through the vertex without intravenous contrast. COMPARISON:  None. FINDINGS: 1.0 by 0.5 cm lacunar infarct in the right cerebellum, image 10 series 2, chronic appearing. Periventricular white matter and corona radiata hypodensities favor chronic ischemic microvascular white matter disease. Otherwise, the brainstem, cerebellum, cerebral peduncles, thalami, basal ganglia, basilar cisterns, and ventricular system appear within normal limits. No intracranial hemorrhage, mass lesion, or acute CVA. Mild chronic ethmoid sinusitis. There is atherosclerotic calcification of the cavernous carotid arteries bilaterally. IMPRESSION: 1. No acute intracranial findings are identified. 2. Periventricular white matter and corona radiata hypodensities favor chronic ischemic microvascular white matter disease. 3. Remote right upper cerebellar lacunar infarct (chronic). 4. Chronic ethmoid sinusitis. Electronically Signed: By: Gaylyn Rong M.D. On: 05/24/2015 17:37   Mr Brain Wo Contrast  05/24/2015  CLINICAL DATA:  Dizziness.  Stroke. EXAM: MRI HEAD WITHOUT CONTRAST TECHNIQUE: Multiplanar, multiecho pulse sequences of the brain and surrounding structures were obtained without intravenous contrast. COMPARISON:  CT head 05/24/2015 FINDINGS:  Negative for acute infarct. Moderate chronic microvascular ischemic changes in the white matter bilaterally. Brainstem intact. Tiny chronic infarcts in the cerebellum bilaterally. Negative for intracranial hemorrhage.  Negative for mass or edema. Cerebral volume normal for age. No hydrocephalus or shift of the midline structures Paranasal sinuses clear. Normal orbit bilaterally. Pituitary normal in size IMPRESSION: No acute abnormality. Moderate chronic microvascular ischemic changes in the white matter. Electronically Signed   By: Marlan Palau M.D.   On: 05/24/2015 19:33   Dg Chest Portable 1 View  05/24/2015  CLINICAL DATA:  Generalized weakness EXAM: PORTABLE CHEST 1 VIEW COMPARISON:  January 09, 2015 FINDINGS: There is a large paraesophageal type hernia. There is no edema or consolidation. Heart is upper normal in size with  pulmonary vascularity within normal limits. There is degenerative change in each shoulder. IMPRESSION: Large paraesophageal type hernia. No edema or consolidation apparent. Electronically Signed   By: Bretta Bang III M.D.   On: 05/24/2015 18:07   I have personally reviewed and evaluated these images and lab results as part of my medical decision-making.   EKG Interpretation None      MDM   82 yo F with PMHx as above who p/w confusion. Activated as CODE STROKE en route but on assessment, history and exam is more c/w general delirium/disorientation, possibly 2/2 underlying medical condition. No focal neuro deficits. No seizure like activity. Pt seen by Neuro and stat CT head shows NAICAI. Neuro recommended MRI which is negative for acute stroke. Labs show +UTI, otherwise no acute metabolic abnormality. Suspect delirium 2/2 UTI. Will admit for IV ABX, monitoring, PT. Pt in agreement. Family updated.  Clinical Impression: 1. UTI (lower urinary tract infection)   2. Generalized weakness   3. Dizziness   4. Stroke-like symptoms   5. Confused but orients easily   6.  Hypothyroidism, unspecified hypothyroidism type     Disposition: Admit  Condition: Stable  Pt seen in conjunction with Dr. Collie Siad, MD 05/25/15 7829  Derwood Kaplan, MD 05/26/15 1306

## 2015-05-24 NOTE — Consult Note (Signed)
Requesting Physician: Dr.  Rhunette Croft    Reason for consultation:  Stroke code  HPI:                                                                                                                                         Kristy Watts is an 82 y.o. female patient who  was brought into the emergency room for evaluation of stroke like symptoms. Per family patient woke up this morning not feeling well. There've been some intermittent episodes of slurred speech after which EMS was called. When they noticed some slurred speech, and had some dizziness symptoms, stroke code was called in the field. CT of the head is negative. Patient denies any speech problems or any focal weakness. She reports having dizziness and nausea. Has baseline cognitive impairment, poor historian.   Date last known well:  2017, April 17th Time last known well:  At  3.30 pm tPA Given: No: non focal exam  Stroke Risk Factors - hypertension  Past Medical History: History reviewed. No pertinent past medical history.  No past surgical history on file.  Family History: No family history on file.  Social History:   has no tobacco, alcohol, and drug history on file.  Allergies:  No Known Allergies   Medications:                                                                                                                         Current facility-administered medications:  .  aspirin suppository 300 mg, 300 mg, Rectal, Once, Harshil Cavallaro Daniel Nones, MD  Current outpatient prescriptions:  .  aspirin EC 81 MG tablet, Take 81 mg by mouth daily., Disp: , Rfl:  .  atorvastatin (LIPITOR) 10 MG tablet, Take 10 mg by mouth daily., Disp: , Rfl:  .  carvedilol (COREG) 3.125 MG tablet, Take 3.125 mg by mouth 2 (two) times daily with a meal., Disp: , Rfl:  .  dicyclomine (BENTYL) 10 MG capsule, Take 10 mg by mouth 4 (four) times daily -  before meals and at bedtime., Disp: , Rfl:  .  docusate sodium (COLACE) 100 MG capsule,  Take 100 mg by mouth daily as needed for mild constipation., Disp: , Rfl:  .  furosemide (LASIX) 40 MG tablet, Take 40 mg by mouth daily., Disp: , Rfl:  .  isosorbide mononitrate (IMDUR) 60 MG 24 hr  tablet, Take 60 mg by mouth daily., Disp: , Rfl:  .  levothyroxine (SYNTHROID, LEVOTHROID) 50 MCG tablet, Take 50 mcg by mouth daily before breakfast., Disp: , Rfl:  .  losartan (COZAAR) 25 MG tablet, Take 25 mg by mouth daily., Disp: , Rfl:  .  omeprazole (PRILOSEC) 20 MG capsule, Take 20 mg by mouth daily., Disp: , Rfl:  .  polyethylene glycol (MIRALAX / GLYCOLAX) packet, Take 17 g by mouth daily as needed for mild constipation., Disp: , Rfl:  .  potassium chloride SA (K-DUR,KLOR-CON) 20 MEQ tablet, Take 1 tablet (20 mEq total) by mouth daily., Disp: 5 tablet, Rfl: 0 .  prochlorperazine (COMPAZINE) 10 MG tablet, Take 10 mg by mouth every 6 (six) hours as needed for nausea or vomiting., Disp: , Rfl:  .  simethicone (MYLICON) 80 MG chewable tablet, Chew 80 mg by mouth every 6 (six) hours as needed for flatulence., Disp: , Rfl:  .  temazepam (RESTORIL) 15 MG capsule, Take 15 mg by mouth at bedtime as needed for sleep., Disp: , Rfl:  .  traMADol (ULTRAM) 50 MG tablet, Take 50 mg by mouth every 6 (six) hours as needed for moderate pain., Disp: , Rfl:  .  Valerian 500 MG CAPS, Take 500 mg by mouth at bedtime., Disp: , Rfl:    ROS:                                                                                                                                       History obtained from the patient  General ROS: negative for - chills, fatigue, fever, night sweats, weight gain or weight loss Psychological ROS: negative for - behavioral disorder, hallucinations, memory difficulties, mood swings or suicidal ideation Ophthalmic ROS: negative for - blurry vision, double vision, eye pain or loss of vision ENT ROS: negative for - epistaxis, nasal discharge, oral lesions, sore throat, tinnitus or vertigo Allergy  and Immunology ROS: negative for - hives or itchy/watery eyes Hematological and Lymphatic ROS: negative for - bleeding problems, bruising or swollen lymph nodes Endocrine ROS: negative for - galactorrhea, hair pattern changes, polydipsia/polyuria or temperature intolerance Respiratory ROS: negative for - cough, hemoptysis, shortness of breath or wheezing Cardiovascular ROS: negative for - chest pain, dyspnea on exertion, edema or irregular heartbeat Gastrointestinal ROS: has nausea, otherwise negative for - abdominal pain, diarrhea, hematemesis,  stool incontinence Genito-Urinary ROS: negative for - dysuria, hematuria, incontinence or urinary frequency/urgency Musculoskeletal ROS: negative for - joint swelling or muscular weakness Neurological ROS: as noted in HPI Dermatological ROS: negative for rash and skin lesion changes  Neurologic Examination:  There were no vitals filed for this visit.  Evaluation of higher integrative functions including: Level of alertness: Alert,  Oriented to time, place and person Speech: fluent, no evidence of dysarthria or aphasia noted.  Test the following cranial nerves: 2-12 grossly intact Motor examination: Normal tone, bulk, full 5/5 motor strength in all 4 extremities Examination of sensation : Normal and symmetric sensation to pinprick in all 4 extremities and on face Examination of deep tendon reflexes: 2+, normal and symmetric in all extremities, normal plantars bilaterally Test coordination: Normal finger nose testing, with no evidence of limb appendicular ataxia or abnormal involuntary movements or tremors noted.    Lab Results: Basic Metabolic Panel:  Recent Labs Lab 05/24/15 1724  NA 142  K 3.7  CL 103  GLUCOSE 95  BUN 17  CREATININE 0.70    Liver Function Tests: No results for input(s): AST, ALT, ALKPHOS, BILITOT, PROT, ALBUMIN in the  last 168 hours. No results for input(s): LIPASE, AMYLASE in the last 168 hours. No results for input(s): AMMONIA in the last 168 hours.  CBC:  Recent Labs Lab 05/24/15 1721 05/24/15 1724  WBC 4.6  --   NEUTROABS 2.4  --   HGB 12.1 13.9  HCT 38.6 41.0  MCV 98.7  --   PLT 190  --     Cardiac Enzymes: No results for input(s): CKTOTAL, CKMB, CKMBINDEX, TROPONINI in the last 168 hours.  Lipid Panel: No results for input(s): CHOL, TRIG, HDL, CHOLHDL, VLDL, LDLCALC in the last 168 hours.  CBG: No results for input(s): GLUCAP in the last 168 hours.  Microbiology: Results for orders placed or performed during the hospital encounter of 01/09/15  Urine culture     Status: None   Collection Time: 01/09/15  4:37 PM  Result Value Ref Range Status   Specimen Description URINE, CLEAN CATCH  Final   Special Requests Normal  Final   Culture MULTIPLE SPECIES PRESENT, SUGGEST RECOLLECTION  Final   Report Status 01/11/2015 FINAL  Final     Imaging: Ct Head Wo Contrast  05/24/2015  CLINICAL DATA:  Right-sided weakness and facial weakness. Slurred speech. EXAM: CT HEAD WITHOUT CONTRAST TECHNIQUE: Contiguous axial images were obtained from the base of the skull through the vertex without intravenous contrast. COMPARISON:  None. FINDINGS: 1.0 by 0.5 cm lacunar infarct in the right cerebellum, image 10 series 2, chronic appearing. Periventricular white matter and corona radiata hypodensities favor chronic ischemic microvascular white matter disease. Otherwise, the brainstem, cerebellum, cerebral peduncles, thalami, basal ganglia, basilar cisterns, and ventricular system appear within normal limits. No intracranial hemorrhage, mass lesion, or acute CVA. Mild chronic ethmoid sinusitis. There is atherosclerotic calcification of the cavernous carotid arteries bilaterally. IMPRESSION: 1. No acute intracranial findings are identified. 2. Periventricular white matter and corona radiata hypodensities favor  chronic ischemic microvascular white matter disease. 3. Remote right upper cerebellar lacunar infarct (chronic). 4. Chronic ethmoid sinusitis. Electronically Signed   By: Gaylyn Rong M.D.   On: 05/24/2015 17:37     Stroke Scales   NIHSS :  0  [place] Stroke Program Early CT score (ASPECTS) : 10  Pre stroke Modified Rankin Scale (mRS): 0    Assessment and plan:   Kristy Watts is an 82 y.o. female patient who presented with questionable strokelike symptoms of slurred speech and not feeling well since this morning. Per patient, she had some dizziness and nausea symptoms and her neurologic examination is nonfocal. No clinical evidence of acute stroke. CT of the  head is negative. Per patient's family she woke up not feeling well and hence her last normal was considered to be last night, greater than 16 hours ago. Hence is not considered a TPA candidate.  To rule out any evidence of posterior circulation stroke causing her dizziness and nausea symptoms, recommend MRI of the brain, and monitoring until resolution of the symptoms and completion of the MRI study. If the MRI shows acute stroke, then will be admitted for further stroke workup to the hospitalist service.   Discussed with ER provider.

## 2015-05-24 NOTE — Progress Notes (Signed)
PHARMACIST - PHYSICIAN ORDER COMMUNICATION  CONCERNING: P&T Medication Policy on Herbal Medications  DESCRIPTION:  This patient's order for:  Valerian  has been noted.  This product(s) is classified as an "herbal" or natural product. Due to a lack of definitive safety studies or FDA approval, nonstandard manufacturing practices, plus the potential risk of unknown drug-drug interactions while on inpatient medications, the Pharmacy and Therapeutics Committee does not permit the use of "herbal" or natural products of this type within [facility].   ACTION TAKEN: The pharmacy department is unable to verify this order at this time and your patient has been informed of this safety policy. Please reevaluate patient's clinical condition at discharge and address if the herbal or natural product(s) should be resumed at that time.  Georgina Pillion, PharmD, BCPS Clinical Pharmacist Pager: [phone] 05/24/2015 9:40 PM

## 2015-05-24 NOTE — ED Notes (Signed)
Heart healthy diet was provided

## 2015-05-24 NOTE — H&P (Signed)
[facility] History and Physical  Gaile Allmon ZOX:[id number] DOB: 12-10-1925 DOA: 05/24/2015  Referring physician: ED physician PCP: No PCP Per Patient  Specialists:   Chief Complaint: Slurred speech, AMS and mildly increased urinary frequency.  HPI: Osie Merkin is a 82 y.o. female with PMH of hypertension, hyperlipidemia, GERD, hypothyroidism, stroke, hiatal hernia, CAD/MI, who presents with a slurred speech, altered mental status and mildly increased urinary frequency.  Per patient's son, patient was last known normal at about 10 PM last night. Patient slept to 1:00 PM. Pt was noted to be confused today. She seems to be unable to find her words with some slurred speech. She also has dizziness. When I saw pt in ED, she is mildly confused, but orientated to place, person and time. She states that she has a pressure-like feeling over posterior ear area and right lower jaw. No chest pain, shortness of breath, fever or chills. She denies any numbness, tingling or unilateral weakness in extremities. No vision change or hearing loss. She has some mild nausea, but no abdominal pain, vomiting or diarrhea. She has some mild vague increased urinary frequency, but no dysuria or burning on urination. Neurology was consulted by EDP, recommend an MRI to rule out a posterior stroke, which is negative for acute stroke.  In ED, patient was found to have positive UA with large amount of leukocytes and with 6-30 squamous epithelial cell. INR 1.14, PTT 36, positive UDS for opiates, CK 59, alcohol level less than 5, temperature normal, bradycardia, oxygen saturation 85-94% on room air, electrolytes and renal function okay.   CT-head showed no acute intracranial findings,  preriventricular white matter and corona radiata hypodensities, favor chronic ischemic microvascular white matter disease; remote right upper cerebellar lacunar infarct (chronic); chronic ethmoid sinusitis. MRI-brain showed no acute  abnormality; noderate chronic microvascular ischemic changes in the white matter.   EKG: Not done in ED, will get one.   Where does patient live?   At home   Can patient participate in ADLs?   Little   Review of Systems:   General: no fevers, chills, no changes in body weight, has fatigue HEENT: no blurry vision, hearing changes or sore throat Pulm: no dyspnea, coughing, wheezing CV: no chest pain, no palpitations Abd: has nausea, no vomiting, abdominal pain, diarrhea, constipation GU: no dysuria, burning on urination, has increased urinary frequency, no hematuria  Ext: no leg edema Neuro: no unilateral weakness, no vision change or hearing loss. Has dizziness, slurred speech and confusion Skin: no rash MSK: No muscle spasm, no deformity, no limitation of range of movement in spin Heme: No easy bruising.  Travel history: No recent long distant travel.  Allergy: No Known Allergies  Past Medical History  Diagnosis Date  . MI (myocardial infarction) (HCC)   . Stroke (HCC)   . Hernia, hiatal   . Essential hypertension   . HLD (hyperlipidemia)   . GERD (gastroesophageal reflux disease)   . Hypothyroidism     Past Surgical History  Procedure Laterality Date  . Cholecystectomy      Social History:  reports that she has never smoked. She does not have any smokeless tobacco history on file. She reports that she drinks about 2.4 oz of alcohol per week. She reports that she does not use illicit drugs.  Family History:  Family History  Problem Relation Age of Onset  . Intracerebral hemorrhage Mother   . Heart attack Father   . Leukemia Brother      Prior to  Admission medications   Medication Sig Start Date End Date Taking? Authorizing Provider  aspirin EC 81 MG tablet Take 81 mg by mouth daily.   Yes Historical Provider, MD  atorvastatin (LIPITOR) 10 MG tablet Take 10 mg by mouth daily.   Yes Historical Provider, MD  carvedilol (COREG) 3.125 MG tablet Take 3.125 mg by mouth  2 (two) times daily with a meal.   Yes Historical Provider, MD  dicyclomine (BENTYL) 10 MG capsule Take 10 mg by mouth 4 (four) times daily -  before meals and at bedtime.   Yes Historical Provider, MD  docusate sodium (COLACE) 100 MG capsule Take 100 mg by mouth 2 (two) times daily.    Yes Historical Provider, MD  furosemide (LASIX) 40 MG tablet Take 40 mg by mouth daily as needed for fluid.    Yes Historical Provider, MD  gabapentin (NEURONTIN) 100 MG capsule Take 100 mg by mouth See admin instructions. Take one tablet in the morning and 2 tablets in the evening   Yes Historical Provider, MD  isosorbide mononitrate (IMDUR) 60 MG 24 hr tablet Take 60 mg by mouth daily.   Yes Historical Provider, MD  levothyroxine (SYNTHROID, LEVOTHROID) 50 MCG tablet Take 50 mcg by mouth daily before breakfast.   Yes Historical Provider, MD  losartan (COZAAR) 25 MG tablet Take 25 mg by mouth daily.   Yes Historical Provider, MD  omeprazole (PRILOSEC) 20 MG capsule Take 20 mg by mouth daily.   Yes Historical Provider, MD  polyethylene glycol (MIRALAX / GLYCOLAX) packet Take 17 g by mouth daily as needed for mild constipation.   Yes Historical Provider, MD  prochlorperazine (COMPAZINE) 10 MG tablet Take 10 mg by mouth every 6 (six) hours as needed for nausea or vomiting.   Yes Historical Provider, MD  traZODone (DESYREL) 50 MG tablet Take 50 mg by mouth daily.   Yes Historical Provider, MD  Valerian 500 MG CAPS Take 500 mg by mouth at bedtime.   Yes Historical Provider, MD  potassium chloride SA (K-DUR,KLOR-CON) 20 MEQ tablet Take 1 tablet (20 mEq total) by mouth daily. Patient not taking: Reported on 05/24/2015 01/09/15   Mancel Bale, MD    Physical Exam: Filed Vitals:   05/24/15 1800 05/24/15 1815 05/24/15 2030 05/24/15 2041  BP: 124/72 129/68 100/61   Pulse: 58 58 57   Temp:    97.9 F (36.6 C)  TempSrc:      Resp: Height:      Weight:      SpO2: 96% 85% 94%    General: Not in acute  distress HEENT:       Eyes: PERRL, EOMI, no scleral icterus.       ENT: No discharge from the ears and nose, no pharynx injection, no tonsillar enlargement.        Neck: No JVD, no bruit, no mass felt. Heme: No neck lymph node enlargement. Cardiac: S1/S2, RRR, No murmurs, No gallops or rubs. Pulm: No rales, wheezing, rhonchi or rubs. Abd: Soft, nondistended, nontender, no rebound pain, no organomegaly, BS present. Ext: No pitting leg edema bilaterally. 2+DP/PT pulse bilaterally. Musculoskeletal: No joint deformities, No joint redness or warmth, no limitation of ROM in spin. Skin: No rashes.  Neuro: Alert, mildly confused, but oriented X3, cranial nerves II-XII grossly intact, moves all extremities normally. Muscle strength 5/5 in all extremities, sensation to light touch intact. Knee reflex 1+ bilaterally. Negative Babinski's sign. Psych: Patient is not psychotic, no suicidal  or hemocidal ideation.  Labs on Admission:  Basic Metabolic Panel:  Recent Labs Lab 05/24/15 1721 05/24/15 1724  NA 141 142  K 3.8 3.7  CL 106 103  CO2 23  --   GLUCOSE 98 95  BUN 14 17  CREATININE 0.90 0.70  CALCIUM 8.6*  --    Liver Function Tests:  Recent Labs Lab 05/24/15 1721  AST 20  ALT 11*  ALKPHOS 67  BILITOT 1.0  PROT 6.6  ALBUMIN 3.3*   No results for input(s): LIPASE, AMYLASE in the last 168 hours. No results for input(s): AMMONIA in the last 168 hours. CBC:  Recent Labs Lab 05/24/15 1721 05/24/15 1724  WBC 4.6  --   NEUTROABS 2.4  --   HGB 12.1 13.9  HCT 38.6 41.0  MCV 98.7  --   PLT 190  --    Cardiac Enzymes:  Recent Labs Lab 05/24/15 1833  CKTOTAL 59    BNP (last 3 results) No results for input(s): BNP in the last 8760 hours.  ProBNP (last 3 results) No results for input(s): PROBNP in the last 8760 hours.  CBG: No results for input(s): GLUCAP in the last 168 hours.  Radiological Exams on Admission: Ct Head Wo Contrast  05/24/2015  ADDENDUM REPORT:  05/24/2015 17:42 ADDENDUM: The original report was by Dr. Gaylyn Rong. The following addendum is by Dr. Gaylyn Rong: These results were called by telephone at the time of interpretation on 05/24/2015 at 5:40 pm to Dr. Lavon Paganini, who verbally acknowledged these results. Electronically Signed   By: Gaylyn Rong M.D.   On: 05/24/2015 17:42  05/24/2015  CLINICAL DATA:  Right-sided weakness and facial weakness. Slurred speech. EXAM: CT HEAD WITHOUT CONTRAST TECHNIQUE: Contiguous axial images were obtained from the base of the skull through the vertex without intravenous contrast. COMPARISON:  None. FINDINGS: 1.0 by 0.5 cm lacunar infarct in the right cerebellum, image 10 series 2, chronic appearing. Periventricular white matter and corona radiata hypodensities favor chronic ischemic microvascular white matter disease. Otherwise, the brainstem, cerebellum, cerebral peduncles, thalami, basal ganglia, basilar cisterns, and ventricular system appear within normal limits. No intracranial hemorrhage, mass lesion, or acute CVA. Mild chronic ethmoid sinusitis. There is atherosclerotic calcification of the cavernous carotid arteries bilaterally. IMPRESSION: 1. No acute intracranial findings are identified. 2. Periventricular white matter and corona radiata hypodensities favor chronic ischemic microvascular white matter disease. 3. Remote right upper cerebellar lacunar infarct (chronic). 4. Chronic ethmoid sinusitis. Electronically Signed: By: Gaylyn Rong M.D. On: 05/24/2015 17:37   Mr Brain Wo Contrast  05/24/2015  CLINICAL DATA:  Dizziness.  Stroke. EXAM: MRI HEAD WITHOUT CONTRAST TECHNIQUE: Multiplanar, multiecho pulse sequences of the brain and surrounding structures were obtained without intravenous contrast. COMPARISON:  CT head 05/24/2015 FINDINGS: Negative for acute infarct. Moderate chronic microvascular ischemic changes in the white matter bilaterally. Brainstem intact. Tiny chronic infarcts in  the cerebellum bilaterally. Negative for intracranial hemorrhage.  Negative for mass or edema. Cerebral volume normal for age. No hydrocephalus or shift of the midline structures Paranasal sinuses clear. Normal orbit bilaterally. Pituitary normal in size IMPRESSION: No acute abnormality. Moderate chronic microvascular ischemic changes in the white matter. Electronically Signed   By: Marlan Palau M.D.   On: 05/24/2015 19:33   Dg Chest Portable 1 View  05/24/2015  CLINICAL DATA:  Generalized weakness EXAM: PORTABLE CHEST 1 VIEW COMPARISON:  January 09, 2015 FINDINGS: There is a large paraesophageal type hernia. There is no edema or consolidation. Heart is upper  normal in size with pulmonary vascularity within normal limits. There is degenerative change in each shoulder. IMPRESSION: Large paraesophageal type hernia. No edema or consolidation apparent. Electronically Signed   By: Bretta Bang III M.D.   On: 05/24/2015 18:07    Assessment/Plan Principal Problem:   Acute encephalopathy Active Problems:   MI (myocardial infarction) (HCC)   Stroke (HCC)   CAD (coronary artery disease)   Slurred speech   Hypothyroidism   UTI (lower urinary tract infection)   GERD (gastroesophageal reflux disease)   Acute encephalopathy: Etiology is not clear. Differential diagnoses include TIA/stroke and possible UTI. Neurology was consulted, Dr.Nandigam saw patient, recommended MRI of brain, which is negative for acute stroke. Her urinalysis is positive with a large amount of leukocyte, but contains 6-30 squamous epithelial cells. Though cannot completely rule out contamination, pt has AMS and mildly increased urinary frequency, will start antibiotics empirically for UTI.  -will admit to tele bed for observation -appreciate neurology's consultation, will follow-up for further recommendations -bed-side swallowing study -frequent neuro check -On ASA  Possible UTI: -IV Rocephin was started in the emergency  room, will continue -Follow-up blood culture and urine culture  Hx of stroke: -on ASA and lipitor  CAD and hx of MI: no CP now. -check trop x 1  Given discomfort over right jaw area -On ASA and lipitor  Hypothyroidism: Last TSH was not on record -Continue home Synthroid -Check TSH  GERD: -Protonix   DVT ppx: SQ Lovenox  Code Status: Full code Family Communication:  Yes, patient's 2 sons   at bed side Disposition Plan: Admit to inpatient   Date of Service 05/24/2015    Lorretta Harp [facility] Pager [phone]  If 7PM-7AM, please contact night-coverage [email or web address] Password [email or web address] 05/24/2015, 9:07 PM

## 2015-05-24 NOTE — Code Documentation (Signed)
82 year old female presents to [facility] via [facility] as code stroke -called in the field.  On arrival patient is alert - answers questions - missed month - fluent speech - moving all extremities - to CT scan.  She c/o nausea and reports headache earlier today.  NIHHS 2 - mild nasolabial fold right side - missed month.  In speaking with her daughter in law - she slept til 1 pm today - got self dressed - was "not her self" - not talkative - no interest in food or watching TV - she sat in chair resting - family states she has not felt well for 24-48 hours - at 330 pm she began to talk - confused - unable to find her words - some slurred speech - no motor movement deficits - EMS reported some confusion with word finding - clearing on transport. LSW last pm at 10 pm per daughter in law.  BP 110/72 HR 52 - Zofran given for nausea - NS 500 cc IV bolus started.  Dr. Lavon Paganini present - no acute treatment.  Handoff to [employer] - to call as needed.

## 2015-05-24 NOTE — ED Notes (Signed)
Per [facility], Patient is coming from home with family. Pt was talking with her daughter-in-law when her daughter-in-law noticed a sudden change of speech and expression. Pt was having some slurred speech, expressive aphasia, and disoriented. Pt reported right sided-facial numbness. Family called EMS after one hour of no improvement. 108 CBG, 110/90 BP. Pt is alert to self. Pt has no motor symptoms. Reports intermittent headache for a couple days. Complains of nausea.

## 2015-05-24 NOTE — ED Notes (Signed)
Heart healthy diet will be ordered

## 2015-05-25 DIAGNOSIS — R5383 Other fatigue: Secondary | ICD-10-CM | POA: Diagnosis present

## 2015-05-25 DIAGNOSIS — J322 Chronic ethmoidal sinusitis: Secondary | ICD-10-CM | POA: Diagnosis present

## 2015-05-25 DIAGNOSIS — N390 Urinary tract infection, site not specified: Secondary | ICD-10-CM | POA: Diagnosis present

## 2015-05-25 DIAGNOSIS — R42 Dizziness and giddiness: Secondary | ICD-10-CM | POA: Diagnosis not present

## 2015-05-25 DIAGNOSIS — R4781 Slurred speech: Secondary | ICD-10-CM | POA: Diagnosis present

## 2015-05-25 DIAGNOSIS — I2510 Atherosclerotic heart disease of native coronary artery without angina pectoris: Secondary | ICD-10-CM

## 2015-05-25 DIAGNOSIS — G92 Toxic encephalopathy: Secondary | ICD-10-CM | POA: Diagnosis present

## 2015-05-25 DIAGNOSIS — E039 Hypothyroidism, unspecified: Secondary | ICD-10-CM | POA: Diagnosis present

## 2015-05-25 DIAGNOSIS — E785 Hyperlipidemia, unspecified: Secondary | ICD-10-CM | POA: Diagnosis present

## 2015-05-25 DIAGNOSIS — Z7982 Long term (current) use of aspirin: Secondary | ICD-10-CM | POA: Diagnosis not present

## 2015-05-25 DIAGNOSIS — G9340 Encephalopathy, unspecified: Secondary | ICD-10-CM | POA: Diagnosis not present

## 2015-05-25 DIAGNOSIS — K219 Gastro-esophageal reflux disease without esophagitis: Secondary | ICD-10-CM | POA: Diagnosis present

## 2015-05-25 DIAGNOSIS — I10 Essential (primary) hypertension: Secondary | ICD-10-CM | POA: Diagnosis present

## 2015-05-25 DIAGNOSIS — I252 Old myocardial infarction: Secondary | ICD-10-CM | POA: Diagnosis not present

## 2015-05-25 DIAGNOSIS — Z23 Encounter for immunization: Secondary | ICD-10-CM | POA: Diagnosis not present

## 2015-05-25 LAB — GLUCOSE, CAPILLARY: GLUCOSE-CAPILLARY: 120 mg/dL — AB (ref 65–99)

## 2015-05-25 LAB — LIPID PANEL
CHOL/HDL RATIO: 2.7 ratio
CHOLESTEROL: 104 mg/dL (ref 0–200)
HDL: 39 mg/dL — ABNORMAL LOW (ref >40)
LDL Cholesterol: 55 mg/dL (ref 0–99)
Triglycerides: 49 mg/dL (ref <150)
VLDL: 10 mg/dL (ref 0–40)

## 2015-05-25 LAB — TSH: TSH: 3.197 u[IU]/mL (ref 0.350–4.500)

## 2015-05-25 NOTE — Evaluation (Signed)
Physical Therapy Evaluation Patient Details Name: Kristy Watts MRN: [id number] DOB: November 30, 1925 Today's Date: 05/25/2015   History of Present Illness  Pt adm with slurred speech, dizziness, and AMS. MRI negative. PMH - HTN, CAD, CVA, MI  Clinical Impression  Pt admitted with above diagnosis and presents to PT with functional limitations due to deficits listed below (See PT problem list). Pt needs skilled PT to maximize independence and safety to allow discharge to home. Will follow acutely but will not need PT after discharge.     Follow Up Recommendations No PT follow up    Equipment Recommendations  None recommended by PT    Recommendations for Other Services       Precautions / Restrictions Precautions Precautions: Fall      Mobility  Bed Mobility Overal bed mobility: Modified Independent                Transfers Overall transfer level: Modified independent                  Ambulation/Gait Ambulation/Gait assistance: Supervision Ambulation Distance (Feet): 150 Feet Assistive device: Straight cane;4-wheeled walker;None Gait Pattern/deviations: Step-through pattern;Decreased stride length   Gait velocity interpretation: <1.8 ft/sec, indicative of risk for recurrent falls General Gait Details: In room pt used cane and reaching for furniture with free hand. In hallway had pt use rollator for incr stability  Stairs            Wheelchair Mobility    Modified Rankin (Stroke Patients Only)       Balance Overall balance assessment: Needs assistance Sitting-balance support: No upper extremity supported;Feet supported Sitting balance-Leahy Scale: Normal     Standing balance support: No upper extremity supported;During functional activity Standing balance-Leahy Scale: Good                               Pertinent Vitals/Pain Pain Assessment: Faces Faces Pain Scale: Hurts a little bit Pain Location: rt leg Pain Descriptors /  Indicators: Aching Pain Intervention(s): Limited activity within patient's tolerance;Repositioned (Pt denied need for pain meds)    Home Living Family/patient expects to be discharged to:: Private residence Living Arrangements: Alone Available Help at Discharge: Family;Available PRN/intermittently Type of Home: House Home Access: Stairs to enter Entrance Stairs-Rails: Doctor, general practice of Steps: 3-4 Home Layout: Two level (Children have converted den on main level to a bedroom) Home Equipment: Gilmer Mor - single point;Walker - 4 wheels Additional Comments: Lives in [place], [place]. Is just visiting family for vacation    Prior Function Level of Independence: Independent with assistive device(s)         Comments: Uses cane at times but doesn't like to     Hand Dominance        Extremity/Trunk Assessment   Upper Extremity Assessment: Defer to OT evaluation           Lower Extremity Assessment: Overall WFL for tasks assessed         Communication   Communication: No difficulties  Cognition Arousal/Alertness: Awake/alert Behavior During Therapy: WFL for tasks assessed/performed Overall Cognitive Status: Within Functional Limits for tasks assessed       Memory: Decreased short-term memory              General Comments      Exercises        Assessment/Plan    PT Assessment Patient needs continued PT services  PT Diagnosis Abnormality of gait  PT Problem List Decreased balance;Decreased mobility  PT Treatment Interventions DME instruction;Functional mobility training;Balance training;Therapeutic activities   PT Goals (Current goals can be found in the Care Plan section) Acute Rehab PT Goals Patient Stated Goal: Return home PT Goal Formulation: With patient Time For Goal Achievement: 06/01/15 Potential to Achieve Goals: Good    Frequency Min 3X/week   Barriers to discharge Decreased caregiver support Lives in [place] alone     Co-evaluation               End of Session Equipment Utilized During Treatment: Gait belt Activity Tolerance: Patient tolerated treatment well Patient left: in chair;with call bell/phone within reach;with chair alarm set Nurse Communication: Mobility status    Functional Assessment Tool Used: clinical judgement Functional Limitation: Mobility: Walking and moving around Mobility: Walking and Moving Around Current Status [phone]): At least 1 percent but less than 20 percent impaired, limited or restricted Mobility: Walking and Moving Around Goal Status [phone]): At least 1 percent but less than 20 percent impaired, limited or restricted    Time: 0919-0950 PT Time Calculation (min) (ACUTE ONLY): 31 min   Charges:   PT Evaluation $PT Eval Moderate Complexity: 1 Procedure PT Treatments $Gait Training: 8-22 mins   PT G Codes:   PT G-Codes **NOT FOR INPATIENT CLASS** Functional Assessment Tool Used: clinical judgement Functional Limitation: Mobility: Walking and moving around Mobility: Walking and Moving Around Current Status (U9811): At least 1 percent but less than 20 percent impaired, limited or restricted Mobility: Walking and Moving Around Goal Status [phone]): At least 1 percent but less than 20 percent impaired, limited or restricted    [facility] 05/25/2015, 10:38 AM [facility] PT [phone]

## 2015-05-25 NOTE — Progress Notes (Signed)
PROGRESS NOTE    Kristy Watts  TDS:[id number] DOB: 1925-04-24 DOA: 05/24/2015 PCP: No PCP Per Patient     Brief Narrative: Kristy Watts is a 82 y.o. female with PMH of hypertension, hyperlipidemia, GERD, hypothyroidism, stroke, hiatal hernia, CAD/MI, who presents with a slurred speech, altered mental status and mildly increased urinary frequency.  Assessment & Plan:   Principal Problem:   Acute encephalopathy Active Problems:   MI (myocardial infarction) (HCC)   Stroke (HCC)   CAD (coronary artery disease)   Slurred speech   Hypothyroidism   UTI (lower urinary tract infection)   GERD (gastroesophageal reflux disease)  Acute encephalopathy: Negative MRI, and symptoms improved. Neurology consulted and recommended no further work up.  Probably toxic encephalopathy from infection.    UTI: On rocephin, cultures are pending.    H/o stroke: on aspirin.    CAD: NO chest pain .     DVT prophylaxis:lovenox Code Status: full code.  Family Communication: none at bedside Disposition Plan: pending further investigation, afte rthe urine culture report is back.    Consultants:   neurology   Procedures: none  Antimicrobials:   Rocephin 4/18   Subjective: Feeling better, no slurred speech, alert and oriented, frustrated.   Objective: Filed Vitals:   05/25/15 1508 05/25/15 1512 05/25/15 1735 05/25/15 1735  BP: 106/56 107/49 96/46 96/46   Pulse: 53 55 54 54  Temp: 97.3 F (36.3 C)     TempSrc:      Resp: 19 20    Height:      Weight:      SpO2: 95% 95%      Intake/Output Summary (Last 24 hours) at 05/25/15 1807 Last data filed at 05/25/15 1300  Gross per 24 hour  Intake   1720 ml  Output      0 ml  Net   1720 ml   Filed Weights   05/24/15 1730  Weight: 66.679 kg (147 lb)    Examination:  General exam: Appears calm and comfortable  Respiratory system: Clear to auscultation. Respiratory effort normal. Cardiovascular system: S1 & S2 heard, RRR.  No JVD, murmurs, rubs, gallops or clicks. No pedal edema. Gastrointestinal system: Abdomen is nondistended, soft and nontender. No organomegaly or masses felt. Normal bowel sounds heard. Central nervous system: Alert and oriented. No focal neurological deficits. Extremities: Symmetric 5 x 5 power. Skin: No rashes, lesions or ulcers Psychiatry: Judgement and insight appear normal. Mood & affect appropriate.     Data Reviewed: I have personally reviewed following labs and imaging studies  CBC:  Recent Labs Lab 05/24/15 1721 05/24/15 1724  WBC 4.6  --   NEUTROABS 2.4  --   HGB 12.1 13.9  HCT 38.6 41.0  MCV 98.7  --   PLT 190  --    Basic Metabolic Panel:  Recent Labs Lab 05/24/15 1721 05/24/15 1724  NA 141 142  K 3.8 3.7  CL 106 103  CO2 23  --   GLUCOSE 98 95  BUN 14 17  CREATININE 0.90 0.70  CALCIUM 8.6*  --    GFR: Estimated Creatinine Clearance: 41.1 mL/min (by C-G formula based on Cr of 0.7). Liver Function Tests:  Recent Labs Lab 05/24/15 1721  AST 20  ALT 11*  ALKPHOS 67  BILITOT 1.0  PROT 6.6  ALBUMIN 3.3*   No results for input(s): LIPASE, AMYLASE in the last 168 hours. No results for input(s): AMMONIA in the last 168 hours. Coagulation Profile:  Recent Labs Lab 05/24/15 1721  INR 1.14   Cardiac Enzymes:  Recent Labs Lab 05/24/15 1833 05/24/15 2152  CKTOTAL 59  --   TROPONINI  --  <0.03   BNP (last 3 results) No results for input(s): PROBNP in the last 8760 hours. HbA1C: No results for input(s): HGBA1C in the last 72 hours. CBG:  Recent Labs Lab 05/25/15 1144  GLUCAP 120*   Lipid Profile:  Recent Labs  05/25/15 0509  CHOL 104  HDL 39*  LDLCALC 55  TRIG 49  CHOLHDL 2.7   Thyroid Function Tests:  Recent Labs  05/25/15 0509  TSH 3.197   Anemia Panel: No results for input(s): VITAMINB12, FOLATE, FERRITIN, TIBC, IRON, RETICCTPCT in the last 72 hours. Urine analysis:    Component Value Date/Time   COLORURINE  YELLOW 05/24/2015 1830   APPEARANCEUR CLOUDY* 05/24/2015 1830   LABSPEC 1.015 05/24/2015 1830   PHURINE 7.0 05/24/2015 1830   GLUCOSEU NEGATIVE 05/24/2015 1830   HGBUR TRACE* 05/24/2015 1830   BILIRUBINUR NEGATIVE 05/24/2015 1830   KETONESUR 15* 05/24/2015 1830   PROTEINUR NEGATIVE 05/24/2015 1830   NITRITE NEGATIVE 05/24/2015 1830   LEUKOCYTESUR LARGE* 05/24/2015 1830   Sepsis Labs: (procalcitonin:4,lacticidven:4)  ) Recent Results (from the past 240 hour(s))  Blood culture (routine x 2)     Status: None (Preliminary result)   Collection Time: 05/24/15  7:40 PM  Result Value Ref Range Status   Specimen Description BLOOD RIGHT ANTECUBITAL  Final   Special Requests BOTTLES DRAWN AEROBIC ONLY 5CC  Final   Culture NO GROWTH < 24 HOURS  Final   Report Status PENDING  Incomplete  Blood culture (routine x 2)     Status: None (Preliminary result)   Collection Time: 05/24/15  7:45 PM  Result Value Ref Range Status   Specimen Description BLOOD RIGHT HAND  Final   Special Requests BOTTLES DRAWN AEROBIC ONLY 5CC  Final   Culture NO GROWTH < 24 HOURS  Final   Report Status PENDING  Incomplete         Radiology Studies: Ct Head Wo Contrast  05/24/2015  ADDENDUM REPORT: 05/24/2015 17:42 ADDENDUM: The original report was by Dr. Gaylyn Rong. The following addendum is by Dr. Gaylyn Rong: These results were called by telephone at the time of interpretation on 05/24/2015 at 5:40 pm to Dr. Lavon Paganini, who verbally acknowledged these results. Electronically Signed   By: Gaylyn Rong M.D.   On: 05/24/2015 17:42  05/24/2015  CLINICAL DATA:  Right-sided weakness and facial weakness. Slurred speech. EXAM: CT HEAD WITHOUT CONTRAST TECHNIQUE: Contiguous axial images were obtained from the base of the skull through the vertex without intravenous contrast. COMPARISON:  None. FINDINGS: 1.0 by 0.5 cm lacunar infarct in the right cerebellum, image 10 series 2, chronic appearing.  Periventricular white matter and corona radiata hypodensities favor chronic ischemic microvascular white matter disease. Otherwise, the brainstem, cerebellum, cerebral peduncles, thalami, basal ganglia, basilar cisterns, and ventricular system appear within normal limits. No intracranial hemorrhage, mass lesion, or acute CVA. Mild chronic ethmoid sinusitis. There is atherosclerotic calcification of the cavernous carotid arteries bilaterally. IMPRESSION: 1. No acute intracranial findings are identified. 2. Periventricular white matter and corona radiata hypodensities favor chronic ischemic microvascular white matter disease. 3. Remote right upper cerebellar lacunar infarct (chronic). 4. Chronic ethmoid sinusitis. Electronically Signed: By: Gaylyn Rong M.D. On: 05/24/2015 17:37   Mr Brain Wo Contrast  05/24/2015  CLINICAL DATA:  Dizziness.  Stroke. EXAM: MRI HEAD WITHOUT CONTRAST TECHNIQUE: Multiplanar, multiecho pulse sequences of the brain  and surrounding structures were obtained without intravenous contrast. COMPARISON:  CT head 05/24/2015 FINDINGS: Negative for acute infarct. Moderate chronic microvascular ischemic changes in the white matter bilaterally. Brainstem intact. Tiny chronic infarcts in the cerebellum bilaterally. Negative for intracranial hemorrhage.  Negative for mass or edema. Cerebral volume normal for age. No hydrocephalus or shift of the midline structures Paranasal sinuses clear. Normal orbit bilaterally. Pituitary normal in size IMPRESSION: No acute abnormality. Moderate chronic microvascular ischemic changes in the white matter. Electronically Signed   By: Marlan Palau M.D.   On: 05/24/2015 19:33   Dg Chest Portable 1 View  05/24/2015  CLINICAL DATA:  Generalized weakness EXAM: PORTABLE CHEST 1 VIEW COMPARISON:  January 09, 2015 FINDINGS: There is a large paraesophageal type hernia. There is no edema or consolidation. Heart is upper normal in size with pulmonary vascularity  within normal limits. There is degenerative change in each shoulder. IMPRESSION: Large paraesophageal type hernia. No edema or consolidation apparent. Electronically Signed   By: Bretta Bang III M.D.   On: 05/24/2015 18:07        Scheduled Meds: . aspirin EC  81 mg Oral Daily  . atorvastatin  10 mg Oral q1800  . carvedilol  3.125 mg Oral BID WC  . cefTRIAXone (ROCEPHIN)  IV  1 g Intravenous Q24H  . dicyclomine  10 mg Oral TID AC & HS  . docusate sodium  100 mg Oral BID  . enoxaparin (LOVENOX) injection  40 mg Subcutaneous Q24H  . gabapentin  100 mg Oral Daily  . gabapentin  200 mg Oral QHS  . isosorbide mononitrate  60 mg Oral Daily  . levothyroxine  50 mcg Oral QAC breakfast  . losartan  25 mg Oral Daily  . pantoprazole  40 mg Oral Daily  . pneumococcal 23 valent vaccine  0.5 mL Intramuscular Tomorrow-1000  . sodium chloride flush  3 mL Intravenous Q12H  . traZODone  50 mg Oral QHS   Continuous Infusions: . sodium chloride 75 mL/hr at 05/25/15 0602        Time spent: 35 minutes.     Kathlen Mody, MD [facility] Pager [phone]  If 7PM-7AM, please contact night-coverage [email or web address] Password [facility] 05/25/2015, 6:07 PM

## 2015-05-25 NOTE — Care Management Note (Signed)
Case Management Note  Patient Details  Name: Yurianna Beltramo MRN: [id number] Date of Birth: 1925/12/10  Subjective/Objective:                 Patient in obs from home for confusion 2/2 UTI. Confusion resolved today. CM spoke with patient at the bedside. She is visiting her sons in [place] and [address] She lives in [place] in a house alone. She states she is independent and although her sons want her to move here she would have to clean out her house and she would miss her friends too much to move. Patient will return to her son in [place] prior to returning home to [place].   Action/Plan:  No CM needs at this time.  Expected Discharge Date:                  Expected Discharge Plan:  Home/Self Care  In-House Referral:     Discharge planning Services  CM Consult  Post Acute Care Choice:  NA Choice offered to:     DME Arranged:    DME Agency:     HH Arranged:    HH Agency:     Status of Service:  Completed, signed off  Medicare Important Message Given:    Date Medicare IM Given:    Medicare IM give by:    Date Additional Medicare IM Given:    Additional Medicare Important Message give by:     If discussed at Long Length of Stay Meetings, dates discussed:    Additional Comments:  Lawerance Sabal, RN 05/25/2015, 2:15 PM

## 2015-05-25 NOTE — Progress Notes (Signed)
Interval History:                                                                                                                      Kristy Watts is an 82 y.o. female patient with symptoms of slurred speech, dizziness and symptoms of stroke. Patietn was admitted for MRI to rule out CVA. This AM she has no further symptoms but feels some pressure around her eyes from allergies.    Past Medical History: Past Medical History  Diagnosis Date  . MI (myocardial infarction) (HCC)   . Stroke (HCC)   . Hernia, hiatal   . Essential hypertension   . HLD (hyperlipidemia)   . GERD (gastroesophageal reflux disease)   . Hypothyroidism     Past Surgical History  Procedure Laterality Date  . Cholecystectomy      Family History: Family History  Problem Relation Age of Onset  . Intracerebral hemorrhage Mother   . Heart attack Father   . Leukemia Brother     Social History:   reports that she has never smoked. She does not have any smokeless tobacco history on file. She reports that she drinks about 2.4 oz of alcohol per week. She reports that she does not use illicit drugs.  Allergies:  No Known Allergies   Medications:                                                                                                                         Current facility-administered medications:  .  0.9 %  sodium chloride infusion, , Intravenous, Continuous, Lorretta Harp, MD, Last Rate: 75 mL/hr at 05/25/15 0602 .  acetaminophen (TYLENOL) tablet 650 mg, 650 mg, Oral, Q6H PRN, 650 mg at 05/25/15 0211 **OR** acetaminophen (TYLENOL) suppository 650 mg, 650 mg, Rectal, Q6H PRN, Lorretta Harp, MD .  aspirin EC tablet 81 mg, 81 mg, Oral, Daily, Lorretta Harp, MD, 81 mg at 05/25/15 0918 .  atorvastatin (LIPITOR) tablet 10 mg, 10 mg, Oral, q1800, Lorretta Harp, MD, 10 mg at 05/24/15 2227 .  carvedilol (COREG) tablet 3.125 mg, 3.125 mg, Oral, BID WC, Lorretta Harp, MD, 3.125 mg at 05/25/15 0919 .  cefTRIAXone (ROCEPHIN) 1 g in  dextrose 5 % 50 mL IVPB, 1 g, Intravenous, Q24H, Lorretta Harp, MD .  dicyclomine (BENTYL) capsule 10 mg, 10 mg, Oral, TID AC & HS, Lorretta Harp, MD, 10 mg at 05/25/15 0918 .  docusate sodium (COLACE) capsule 100 mg, 100 mg, Oral, BID, Brien Few  Clyde Lundborg, MD, 100 mg at 05/25/15 0918 .  enoxaparin (LOVENOX) injection 40 mg, 40 mg, Subcutaneous, Q24H, Lorretta Harp, MD, 40 mg at 05/25/15 0918 .  gabapentin (NEURONTIN) capsule 100 mg, 100 mg, Oral, Daily, Lorretta Harp, MD, 100 mg at 05/25/15 0918 .  gabapentin (NEURONTIN) capsule 200 mg, 200 mg, Oral, QHS, Lorretta Harp, MD, 200 mg at 05/24/15 2228 .  hydrALAZINE (APRESOLINE) injection 5 mg, 5 mg, Intravenous, Q2H PRN, Lorretta Harp, MD .  isosorbide mononitrate (IMDUR) 24 hr tablet 60 mg, 60 mg, Oral, Daily, Lorretta Harp, MD, 60 mg at 05/25/15 0918 .  levothyroxine (SYNTHROID, LEVOTHROID) tablet 50 mcg, 50 mcg, Oral, QAC breakfast, Lorretta Harp, MD, 50 mcg at 05/25/15 [phone] .  losartan (COZAAR) tablet 25 mg, 25 mg, Oral, Daily, Lorretta Harp, MD, 25 mg at 05/25/15 0919 .  ondansetron (ZOFRAN) injection 4 mg, 4 mg, Intravenous, Q8H PRN, Lorretta Harp, MD .  pantoprazole (PROTONIX) EC tablet 40 mg, 40 mg, Oral, Daily, Lorretta Harp, MD, 40 mg at 05/25/15 0918 .  pneumococcal 23 valent vaccine (PNU-IMMUNE) injection 0.5 mL, 0.5 mL, Intramuscular, Tomorrow-1000, Lorretta Harp, MD .  polyethylene glycol (MIRALAX / GLYCOLAX) packet 17 g, 17 g, Oral, Daily PRN, Lorretta Harp, MD .  sodium chloride flush (NS) 0.9 % injection 3 mL, 3 mL, Intravenous, Q12H, Lorretta Harp, MD, 3 mL at 05/24/15 2200 .  traZODone (DESYREL) tablet 50 mg, 50 mg, Oral, QHS, Lorretta Harp, MD, 50 mg at 05/24/15 2228   Neurologic Examination:                                                                                                     Today's Vitals   05/25/15 0211 05/25/15 0311 05/25/15 0421 05/25/15 0918  BP:   94/41 117/66  Pulse:   64 63  Temp:   98.4 F (36.9 C)   TempSrc:      Resp:   16   Height:      Weight:      SpO2:    92%   PainSc: 6  Asleep      Evaluation of higher integrative functions including: Level of alertness: Alert,  Oriented to time, place and person Speech: fluent, no evidence of dysarthria or aphasia noted.  Test the following cranial nerves: 2-12 grossly intact Motor examination: Normal tone, bulk, full 5/5 motor strength in all 4 extremities Examination of sensation : Normal and symmetric sensation to pinprick in all 4 extremities and on face Examination of deep tendon reflexes: 1+, normal and symmetric in all extremities, normal plantars bilaterally Test coordination: Normal finger nose testing, Gait: Deferred   Lab Results: Basic Metabolic Panel:  Recent Labs Lab 05/24/15 1721 05/24/15 1724  NA 141 142  K 3.8 3.7  CL 106 103  CO2 23  --   GLUCOSE 98 95  BUN 14 17  CREATININE 0.90 0.70  CALCIUM 8.6*  --     Liver Function Tests:  Recent Labs Lab 05/24/15 1721  AST 20  ALT 11*  ALKPHOS 67  BILITOT 1.0  PROT 6.6  ALBUMIN 3.3*  No results for input(s): LIPASE, AMYLASE in the last 168 hours. No results for input(s): AMMONIA in the last 168 hours.  CBC:  Recent Labs Lab 05/24/15 1721 05/24/15 1724  WBC 4.6  --   NEUTROABS 2.4  --   HGB 12.1 13.9  HCT 38.6 41.0  MCV 98.7  --   PLT 190  --     Cardiac Enzymes:  Recent Labs Lab 05/24/15 1833 05/24/15 2152  CKTOTAL 59  --   TROPONINI  --  <0.03    Lipid Panel:  Recent Labs Lab 05/25/15 0509  CHOL 104  TRIG 49  HDL 39*  CHOLHDL 2.7  VLDL 10  LDLCALC 55    CBG: No results for input(s): GLUCAP in the last 168 hours.  Microbiology: Results for orders placed or performed during the hospital encounter of 01/09/15  Urine culture     Status: None   Collection Time: 01/09/15  4:37 PM  Result Value Ref Range Status   Specimen Description URINE, CLEAN CATCH  Final   Special Requests Normal  Final   Culture MULTIPLE SPECIES PRESENT, SUGGEST RECOLLECTION  Final   Report Status 01/11/2015  FINAL  Final    Imaging: Ct Head Wo Contrast  05/24/2015  ADDENDUM REPORT: 05/24/2015 17:42 ADDENDUM: The original report was by Dr. Gaylyn Rong. The following addendum is by Dr. Gaylyn Rong: These results were called by telephone at the time of interpretation on 05/24/2015 at 5:40 pm to Dr. Lavon Paganini, who verbally acknowledged these results. Electronically Signed   By: Gaylyn Rong M.D.   On: 05/24/2015 17:42  05/24/2015  CLINICAL DATA:  Right-sided weakness and facial weakness. Slurred speech. EXAM: CT HEAD WITHOUT CONTRAST TECHNIQUE: Contiguous axial images were obtained from the base of the skull through the vertex without intravenous contrast. COMPARISON:  None. FINDINGS: 1.0 by 0.5 cm lacunar infarct in the right cerebellum, image 10 series 2, chronic appearing. Periventricular white matter and corona radiata hypodensities favor chronic ischemic microvascular white matter disease. Otherwise, the brainstem, cerebellum, cerebral peduncles, thalami, basal ganglia, basilar cisterns, and ventricular system appear within normal limits. No intracranial hemorrhage, mass lesion, or acute CVA. Mild chronic ethmoid sinusitis. There is atherosclerotic calcification of the cavernous carotid arteries bilaterally. IMPRESSION: 1. No acute intracranial findings are identified. 2. Periventricular white matter and corona radiata hypodensities favor chronic ischemic microvascular white matter disease. 3. Remote right upper cerebellar lacunar infarct (chronic). 4. Chronic ethmoid sinusitis. Electronically Signed: By: Gaylyn Rong M.D. On: 05/24/2015 17:37   Mr Brain Wo Contrast  05/24/2015  CLINICAL DATA:  Dizziness.  Stroke. EXAM: MRI HEAD WITHOUT CONTRAST TECHNIQUE: Multiplanar, multiecho pulse sequences of the brain and surrounding structures were obtained without intravenous contrast. COMPARISON:  CT head 05/24/2015 FINDINGS: Negative for acute infarct. Moderate chronic microvascular ischemic  changes in the white matter bilaterally. Brainstem intact. Tiny chronic infarcts in the cerebellum bilaterally. Negative for intracranial hemorrhage.  Negative for mass or edema. Cerebral volume normal for age. No hydrocephalus or shift of the midline structures Paranasal sinuses clear. Normal orbit bilaterally. Pituitary normal in size IMPRESSION: No acute abnormality. Moderate chronic microvascular ischemic changes in the white matter. Electronically Signed   By: Marlan Palau M.D.   On: 05/24/2015 19:33   Dg Chest Portable 1 View  05/24/2015  CLINICAL DATA:  Generalized weakness EXAM: PORTABLE CHEST 1 VIEW COMPARISON:  January 09, 2015 FINDINGS: There is a large paraesophageal type hernia. There is no edema or consolidation. Heart is upper normal in size with  pulmonary vascularity within normal limits. There is degenerative change in each shoulder. IMPRESSION: Large paraesophageal type hernia. No edema or consolidation apparent. Electronically Signed   By: Bretta Bang III M.D.   On: 05/24/2015 18:07    Assessment and plan:   Kristy Watts is an 82 y.o. female patient with dizziness and episode of slurred speech. MRI brain shows no acute infarct and symptoms resolved. At this time would not recommend any further stroke work up.   Will be seen by Dr. Lavon Paganini. Please see his attestation note for A/P for any additional work up recommendations.

## 2015-05-25 NOTE — Evaluation (Signed)
Occupational Therapy Evaluation Patient Details Name: Kristy Watts MRN: [id number] DOB: January 21, 1926 Today's Date: 05/25/2015    History of Present Illness Pt admitted with slurred speech, mildly increased urinary frequency, dizziness, and AMS. MRI negative. PMH - HTN, CAD, CVA, MI, HLD, GERD, hypothyroidism, hiatal hernia.   Clinical Impression   Pt admitted with above. Pt independent with ADLs, PTA. Feel pt will benefit from acute OT to increase independence prior to d/c. Pt upset in session about how her IV beeped for 11 minutes and she kept perseverating on it.    Follow Up Recommendations  No OT follow up;Supervision/Assistance - 24 hour    Equipment Recommendations  3 in 1 bedside comode    Recommendations for Other Services       Precautions / Restrictions Precautions Precautions: Fall Restrictions Weight Bearing Restrictions: No      Mobility Bed Mobility Overal bed mobility: Needs Assistance Bed Mobility: Supine to Sit;Sit to Supine     Supine to sit: Supervision Sit to supine: Supervision      Transfers Overall transfer level: Needs assistance   Transfers: Sit to/from Stand Sit to Stand: Supervision              Balance Simulated functional task while standing with single leg stance and needed UE support.                       ADL Overall ADL's : Needs assistance/impaired             Lower Body Bathing: Set up;Supervison/ safety;Sit to/from stand (doing LB part sitting and part standing)       Lower Body Dressing: Sit to/from stand;Set up;Supervision/safety   Toilet Transfer: Min guard;Ambulation (sit to stand from bed)           Functional mobility during ADLs: Min guard       Vision     Perception     Praxis      Pertinent Vitals/Pain Pain Assessment: Faces Faces Pain Scale: Hurts little more Pain Location: left leg Pain Descriptors / Indicators: Other (Comment) (left leg pain per pt report) Pain  Intervention(s): Monitored during session;Repositioned     Hand Dominance     Extremity/Trunk Assessment Upper Extremity Assessment Upper Extremity Assessment: Generalized weakness (bilateral shoulder flexors weak)   Lower Extremity Assessment Lower Extremity Assessment: Defer to PT evaluation       Communication Communication Communication: No difficulties   Cognition Arousal/Alertness: Awake/alert Behavior During Therapy: WFL for tasks assessed/performed Overall Cognitive Status: No family/caregiver present to determine baseline cognitive functioning (didn't know we were in [place];perseverating on beeping IV; increased time to recall if her son's house where she has been staying had a tub/shower or walk-in shower)                 General Comments       Exercises       Shoulder Instructions      Home Living Family/patient expects to be discharged to:: Private residence Living Arrangements: Alone (staying with son per pt report) Available Help at Discharge: Family;Available PRN/intermittently Type of Home: House Home Access: Stairs to enter [employer] of Steps: 3-4 Entrance Stairs-Rails: Right;Left Home Layout: Two level (children have converted den on main level to a bedroom) Alternate Level Stairs-Number of Steps: 1 flight   Bathroom Shower/Tub: Walk-in shower (son's house has tub/shower )         Home Equipment: Cane - single point;Walker - 4 wheels  Additional Comments: Lives in [place], [place]. Is just visiting family for vacation      Prior Functioning/Environment Level of Independence: Needs assistance    ADL's / Homemaking Assistance Needed: assist with IADL-unsure if she gets help with both cooking and cleaning   Comments: Uses cane at times but doesn't like to    OT Diagnosis: Generalized weakness   OT Problem List: Decreased strength;Decreased cognition;Impaired balance (sitting and/or standing);Decreased knowledge of use of  DME or AE;Pain   OT Treatment/Interventions: Self-care/ADL training;DME and/or AE instruction;Therapeutic activities;Patient/family education;Balance training;Cognitive remediation/compensation;Therapeutic exercise    OT Goals(Current goals can be found in the care plan section) Acute Rehab OT Goals Patient Stated Goal: get out of bed OT Goal Formulation: With patient Time For Goal Achievement: 06/01/15 Potential to Achieve Goals: Good ADL Goals Pt Will Perform Lower Body Dressing: with modified independence;sit to/from stand (including gathering items; using least restrictive device) Pt Will Transfer to Toilet: with modified independence;ambulating (using least restrictive device) Pt Will Perform Tub/Shower Transfer: Tub transfer;with supervision;ambulating;3 in 1 (using least restrictive device) Additional ADL Goal #1: Pt will independently perform HEP for bilateral UEs to increase strength.  OT Frequency: Min 2X/week   Barriers to D/C:            Co-evaluation              End of Session Equipment Utilized During Treatment: Gait belt Nurse Communication: Other (comment) (pt upset/IV beeping)  Activity Tolerance: Patient tolerated treatment well Patient left: in bed;with call bell/phone within reach;with bed alarm set   Time: 6295-2841 OT Time Calculation (min): 16 min Charges:  OT General Charges $OT Visit: 1 Procedure OT Evaluation $OT Eval Moderate Complexity: 1 Procedure G-Codes: OT G-codes **NOT FOR INPATIENT CLASS** Functional Assessment Tool Used: clinical judgment Functional Limitation: Self care Self Care Current Status (L2440): At least 1 percent but less than 20 percent impaired, limited or restricted Self Care Goal Status (N0272): At least 1 percent but less than 20 percent impaired, limited or restricted  Earlie Raveling OTR/L [phone] 05/25/2015, 1:16 PM

## 2015-05-25 NOTE — Care Management Obs Status (Signed)
MEDICARE OBSERVATION STATUS NOTIFICATION   Patient Details  Name: Kristy Watts MRN: [id number] Date of Birth: [birth year]   Medicare Observation Status Notification Given:  Yes    Lawerance Sabal, RN 05/25/2015, 2:15 PM

## 2015-05-26 LAB — HEMOGLOBIN A1C
HEMOGLOBIN A1C: 6.5 % — AB (ref 4.8–5.6)
Mean Plasma Glucose: 140 mg/dL

## 2015-05-26 LAB — URINE CULTURE

## 2015-05-26 LAB — GLUCOSE, CAPILLARY: GLUCOSE-CAPILLARY: 92 mg/dL (ref 65–99)

## 2015-05-26 MED ORDER — CEPHALEXIN 500 MG PO CAPS: 500.0000 mg | ORAL_CAPSULE | Freq: Two times a day (BID) | ORAL | Status: AC

## 2015-05-26 NOTE — Progress Notes (Signed)
Occupational Therapy Treatment Patient Details Name: Kristy Watts MRN: [id number] DOB: Jun 20, 1925 Today's Date: 05/26/2015    History of present illness Pt admitted with slurred speech, mildly increased urinary frequency, dizziness, and AMS. MRI negative. PMH - HTN, CAD, CVA, MI, HLD, GERD, hypothyroidism, hiatal hernia.   OT comments  Pt and daughter in law educated in UB exercise program with pt issued level 1 theraband. Pt able to perform ADL at a supervision level. Eager to discharge later today.  Follow Up Recommendations  No OT follow up;Supervision/Assistance - 24 hour    Equipment Recommendations       Recommendations for Other Services      Precautions / Restrictions Precautions Precautions: Fall Restrictions Weight Bearing Restrictions: No       Mobility Bed Mobility   Bed Mobility: Supine to Sit;Sit to Supine     Supine to sit: Supervision Sit to supine: Supervision      Transfers Overall transfer level: Needs assistance     Sit to Stand: Supervision              Balance     Sitting balance-Leahy Scale: Normal       Standing balance-Leahy Scale: Good                     ADL Overall ADL's : Needs assistance/impaired     Grooming: Wash/dry hands;Supervision/safety;Standing               Lower Body Dressing: Sit to/from stand;Supervision/safety   Toilet Transfer: Ambulation;Comfort height toilet;Grab bars;Supervision/safety   Toileting- Clothing Manipulation and Hygiene: Sit to/from stand;Supervision/safety       Functional mobility during ADLs: Supervision/safety        Emergency planning/management officer   Behavior During Therapy: [facility] for tasks assessed/performed Overall Cognitive Status: History of cognitive impairments - at baseline       Memory: Decreased short-term memory               Extremity/Trunk Assessment               Exercises General  Exercises - Upper Extremity Shoulder Flexion: Strengthening;Both;10 reps;Supine;Theraband Theraband Level (Shoulder Flexion): Level 1 (Yellow) Shoulder Extension: Strengthening;Both;10 reps;Supine;Theraband Theraband Level (Shoulder Extension): Level 1 (Yellow) Shoulder Horizontal ABduction: Strengthening;Both;10 reps;Supine;Theraband Theraband Level (Shoulder Horizontal Abduction): Level 1 (Yellow) Elbow Flexion: Strengthening;Both;10 reps;Supine;Theraband Theraband Level (Elbow Flexion): Level 1 (Yellow) Elbow Extension: Strengthening;Both;10 reps;Supine;Theraband Theraband Level (Elbow Extension): Level 1 (Yellow)   Shoulder Instructions       General Comments      Pertinent Vitals/ Pain       Pain Assessment: Faces Faces Pain Scale: Hurts little more Pain Location: L LE Pain Descriptors / Indicators: Aching Pain Intervention(s): Monitored during session;Repositioned  Home Living                                          Prior Functioning/Environment              Frequency Min 2X/week     Progress Toward Goals  OT Goals(current goals can now be found in the care plan section)  Progress towards OT goals: Progressing toward goals  Acute Rehab OT Goals Patient Stated Goal: go home  Time For Goal Achievement: 06/01/15 Potential to Achieve Goals: Good  Plan Discharge plan remains appropriate    Co-evaluation                 End of Session     Activity Tolerance Patient tolerated treatment well   Patient Left in bed;with call bell/phone within reach;with family/visitor present   Nurse Communication          Time: 8887-5797 OT Time Calculation (min): 37 min  Charges: OT General Charges $OT Visit: 1 Procedure OT Treatments $Self Care/Home Management : 8-22 mins $Therapeutic Exercise: 8-22 mins  Evern Bio 05/26/2015, 1:54 PM  [phone]

## 2015-05-26 NOTE — Discharge Summary (Signed)
Physician Discharge Summary  Camil Hausmann ONG:[id number] DOB: November 02, 1925 DOA: 05/24/2015  PCP: No PCP Per Patient  Admit date: 05/24/2015 Discharge date: 05/26/2015  Time spent: 30 minutes  Recommendations for Outpatient Follow-up:  1. Follow up with PCP in one week.    Discharge Diagnoses:  Principal Problem:   Acute encephalopathy Active Problems:   MI (myocardial infarction) (HCC)   Stroke (HCC)   CAD (coronary artery disease)   Slurred speech   Hypothyroidism   UTI (lower urinary tract infection)   GERD (gastroesophageal reflux disease)   Discharge Condition: improved  Diet recommendation: low sodium diet.   Filed Weights   05/24/15 1730  Weight: 66.679 kg (147 lb)    History of present illness:  Kristy Watts is a 82 y.o. female with PMH of hypertension, hyperlipidemia, GERD, hypothyroidism, stroke, hiatal hernia, CAD/MI, who presents with a slurred speech, altered mental status and mildly increased urinary frequency.  Hospital Course:  Acute encephalopathy: Negative MRI, and symptoms improved. Neurology consulted and recommended no further work up.  Probably toxic encephalopathy from infection. Resolved at this time without any residual effects.    UTI: On rocephin, cultures show multiple bacterial morphotypes.    H/o stroke: on aspirin.    CAD: NO chest pain .   Procedures:  none  Consultations:  none  Discharge Exam: Filed Vitals:   05/26/15 0555 05/26/15 0954  BP: 128/60 141/66  Pulse: 64 55  Temp: 97.9 F (36.6 C)   Resp: 16     General: alert afebrile comfortable.  Cardiovascular: s1s2 Respiratory: ctab.  Discharge Instructions   Discharge Instructions    Diet - low sodium heart healthy    Complete by:  As directed      Discharge instructions    Complete by:  As directed   Follow up with PCP in one week.          Current Discharge Medication List    START taking these medications   Details  cephALEXin (KEFLEX) 500  MG capsule Take 1 capsule (500 mg total) by mouth 2 (two) times daily. Qty: 10 capsule, Refills: 0      CONTINUE these medications which have NOT CHANGED   Details  aspirin EC 81 MG tablet Take 81 mg by mouth daily.    atorvastatin (LIPITOR) 10 MG tablet Take 10 mg by mouth daily.    carvedilol (COREG) 3.125 MG tablet Take 3.125 mg by mouth 2 (two) times daily with a meal.    dicyclomine (BENTYL) 10 MG capsule Take 10 mg by mouth 4 (four) times daily -  before meals and at bedtime.    docusate sodium (COLACE) 100 MG capsule Take 100 mg by mouth 2 (two) times daily.     furosemide (LASIX) 40 MG tablet Take 40 mg by mouth daily as needed for fluid.     gabapentin (NEURONTIN) 100 MG capsule Take 100 mg by mouth See admin instructions. Take one tablet in the morning and 2 tablets in the evening    isosorbide mononitrate (IMDUR) 60 MG 24 hr tablet Take 60 mg by mouth daily.    levothyroxine (SYNTHROID, LEVOTHROID) 50 MCG tablet Take 50 mcg by mouth daily before breakfast.    losartan (COZAAR) 25 MG tablet Take 25 mg by mouth daily.    omeprazole (PRILOSEC) 20 MG capsule Take 20 mg by mouth daily.    polyethylene glycol (MIRALAX / GLYCOLAX) packet Take 17 g by mouth daily as needed for mild constipation.    prochlorperazine (  COMPAZINE) 10 MG tablet Take 10 mg by mouth every 6 (six) hours as needed for nausea or vomiting.    traZODone (DESYREL) 50 MG tablet Take 50 mg by mouth daily.    Valerian 500 MG CAPS Take 500 mg by mouth at bedtime.    potassium chloride SA (K-DUR,KLOR-CON) 20 MEQ tablet Take 1 tablet (20 mEq total) by mouth daily. Qty: 5 tablet, Refills: 0       No Known Allergies Follow-up Information    Schedule an appointment as soon as possible for a visit with [facility].   Specialty:  Internal Medicine   [address] information:   [address] [place] [address] [phone]       The results of significant diagnostics from  this hospitalization (including imaging, microbiology, ancillary and laboratory) are listed below for reference.    Significant Diagnostic Studies: Ct Head Wo Contrast  05/24/2015  ADDENDUM REPORT: 05/24/2015 17:42 ADDENDUM: The original report was by Dr. Gaylyn Rong. The following addendum is by Dr. Gaylyn Rong: These results were called by telephone at the time of interpretation on 05/24/2015 at 5:40 pm to Dr. Lavon Paganini, who verbally acknowledged these results. Electronically Signed   By: Gaylyn Rong M.D.   On: 05/24/2015 17:42  05/24/2015  CLINICAL DATA:  Right-sided weakness and facial weakness. Slurred speech. EXAM: CT HEAD WITHOUT CONTRAST TECHNIQUE: Contiguous axial images were obtained from the base of the skull through the vertex without intravenous contrast. COMPARISON:  None. FINDINGS: 1.0 by 0.5 cm lacunar infarct in the right cerebellum, image 10 series 2, chronic appearing. Periventricular white matter and corona radiata hypodensities favor chronic ischemic microvascular white matter disease. Otherwise, the brainstem, cerebellum, cerebral peduncles, thalami, basal ganglia, basilar cisterns, and ventricular system appear within normal limits. No intracranial hemorrhage, mass lesion, or acute CVA. Mild chronic ethmoid sinusitis. There is atherosclerotic calcification of the cavernous carotid arteries bilaterally. IMPRESSION: 1. No acute intracranial findings are identified. 2. Periventricular white matter and corona radiata hypodensities favor chronic ischemic microvascular white matter disease. 3. Remote right upper cerebellar lacunar infarct (chronic). 4. Chronic ethmoid sinusitis. Electronically Signed: By: Gaylyn Rong M.D. On: 05/24/2015 17:37   Mr Brain Wo Contrast  05/24/2015  CLINICAL DATA:  Dizziness.  Stroke. EXAM: MRI HEAD WITHOUT CONTRAST TECHNIQUE: Multiplanar, multiecho pulse sequences of the brain and surrounding structures were obtained without intravenous  contrast. COMPARISON:  CT head 05/24/2015 FINDINGS: Negative for acute infarct. Moderate chronic microvascular ischemic changes in the white matter bilaterally. Brainstem intact. Tiny chronic infarcts in the cerebellum bilaterally. Negative for intracranial hemorrhage.  Negative for mass or edema. Cerebral volume normal for age. No hydrocephalus or shift of the midline structures Paranasal sinuses clear. Normal orbit bilaterally. Pituitary normal in size IMPRESSION: No acute abnormality. Moderate chronic microvascular ischemic changes in the white matter. Electronically Signed   By: Marlan Palau M.D.   On: 05/24/2015 19:33   Dg Chest Portable 1 View  05/24/2015  CLINICAL DATA:  Generalized weakness EXAM: PORTABLE CHEST 1 VIEW COMPARISON:  January 09, 2015 FINDINGS: There is a large paraesophageal type hernia. There is no edema or consolidation. Heart is upper normal in size with pulmonary vascularity within normal limits. There is degenerative change in each shoulder. IMPRESSION: Large paraesophageal type hernia. No edema or consolidation apparent. Electronically Signed   By: Bretta Bang III M.D.   On: 05/24/2015 18:07    Microbiology: Recent Results (from the past 240 hour(s))  Urine culture  Status: None   Collection Time: 05/24/15  6:30 PM  Result Value Ref Range Status   Specimen Description URINE, CLEAN CATCH  Final   Special Requests NONE  Final   Culture MULTIPLE SPECIES PRESENT, SUGGEST RECOLLECTION  Final   Report Status 05/26/2015 FINAL  Final  Blood culture (routine x 2)     Status: None (Preliminary result)   Collection Time: 05/24/15  7:40 PM  Result Value Ref Range Status   Specimen Description BLOOD RIGHT ANTECUBITAL  Final   Special Requests BOTTLES DRAWN AEROBIC ONLY 5CC  Final   Culture NO GROWTH 2 DAYS  Final   Report Status PENDING  Incomplete  Blood culture (routine x 2)     Status: None (Preliminary result)   Collection Time: 05/24/15  7:45 PM  Result Value  Ref Range Status   Specimen Description BLOOD RIGHT HAND  Final   Special Requests BOTTLES DRAWN AEROBIC ONLY 5CC  Final   Culture NO GROWTH 2 DAYS  Final   Report Status PENDING  Incomplete     Labs: Basic Metabolic Panel:  Recent Labs Lab 05/24/15 1721 05/24/15 1724  NA 141 142  K 3.8 3.7  CL 106 103  CO2 23  --   GLUCOSE 98 95  BUN 14 17  CREATININE 0.90 0.70  CALCIUM 8.6*  --    Liver Function Tests:  Recent Labs Lab 05/24/15 1721  AST 20  ALT 11*  ALKPHOS 67  BILITOT 1.0  PROT 6.6  ALBUMIN 3.3*   No results for input(s): LIPASE, AMYLASE in the last 168 hours. No results for input(s): AMMONIA in the last 168 hours. CBC:  Recent Labs Lab 05/24/15 1721 05/24/15 1724  WBC 4.6  --   NEUTROABS 2.4  --   HGB 12.1 13.9  HCT 38.6 41.0  MCV 98.7  --   PLT 190  --    Cardiac Enzymes:  Recent Labs Lab 05/24/15 1833 05/24/15 2152  CKTOTAL 59  --   TROPONINI  --  <0.03   BNP: BNP (last 3 results) No results for input(s): BNP in the last 8760 hours.  ProBNP (last 3 results) No results for input(s): PROBNP in the last 8760 hours.  CBG:  Recent Labs Lab 05/25/15 1144 05/26/15 0813  GLUCAP 120* 92       Signed:  Rain Wilhide MD.  [facility] 05/26/2015, 4:15 PM

## 2015-05-26 NOTE — Progress Notes (Signed)
Nsg Discharge Note  Admit Date:  05/24/2015 Discharge date: 05/26/2015   Linward Headland to be D/C'd home per MD order.  AVS completed.  Copy for chart, and copy for patient signed, and dated. Patient/daughter-in-law able to verbalize understanding.  Discharge Medication:   Medication List    TAKE these medications        aspirin EC 81 MG tablet  Take 81 mg by mouth daily.     atorvastatin 10 MG tablet  Commonly known as:  LIPITOR  Take 10 mg by mouth daily.     carvedilol 3.125 MG tablet  Commonly known as:  COREG  Take 3.125 mg by mouth 2 (two) times daily with a meal.     cephALEXin 500 MG capsule  Commonly known as:  KEFLEX  Take 1 capsule (500 mg total) by mouth 2 (two) times daily.     dicyclomine 10 MG capsule  Commonly known as:  BENTYL  Take 10 mg by mouth 4 (four) times daily -  before meals and at bedtime.     docusate sodium 100 MG capsule  Commonly known as:  COLACE  Take 100 mg by mouth 2 (two) times daily.     furosemide 40 MG tablet  Commonly known as:  LASIX  Take 40 mg by mouth daily as needed for fluid.     gabapentin 100 MG capsule  Commonly known as:  NEURONTIN  Take 100 mg by mouth See admin instructions. Take one tablet in the morning and 2 tablets in the evening     isosorbide mononitrate 60 MG 24 hr tablet  Commonly known as:  IMDUR  Take 60 mg by mouth daily.     levothyroxine 50 MCG tablet  Commonly known as:  SYNTHROID, LEVOTHROID  Take 50 mcg by mouth daily before breakfast.     losartan 25 MG tablet  Commonly known as:  COZAAR  Take 25 mg by mouth daily.     omeprazole 20 MG capsule  Commonly known as:  PRILOSEC  Take 20 mg by mouth daily.     polyethylene glycol packet  Commonly known as:  MIRALAX / GLYCOLAX  Take 17 g by mouth daily as needed for mild constipation.     potassium chloride SA 20 MEQ tablet  Commonly known as:  K-DUR,KLOR-CON  Take 1 tablet (20 mEq total) by mouth daily.     prochlorperazine 10 MG tablet   Commonly known as:  COMPAZINE  Take 10 mg by mouth every 6 (six) hours as needed for nausea or vomiting.     traZODone 50 MG tablet  Commonly known as:  DESYREL  Take 50 mg by mouth daily.     Valerian 500 MG Caps  Take 500 mg by mouth at bedtime.        Discharge Assessment: Filed Vitals:   05/26/15 0555 05/26/15 0954  BP: 128/60 141/66  Pulse: 64 55  Temp: 97.9 F (36.6 C)   Resp: 16    Skin clean, dry and intact without evidence of skin break down, no evidence of skin tears noted. IV catheters discontinued with catheter tips intact. Site without signs and symptoms of complications - no redness or edema noted at insertion site, patient denies c/o pain - only slight tenderness at site.  Dressing with slight pressure applied.  D/c Instructions-Education: Discharge instructions given to patient/daughter with verbalized understanding. D/c education completed with patient/daughter including follow up instructions, medication list, d/c activities limitations if indicated, with other d/c instructions  as indicated by MD - patient able to verbalize understanding, all questions fully answered. Patient instructed to return to ED, call 911, or call MD for any changes in condition.  RN called volunteer services to take pt to main entrance. Pt in room with family member at this time. Bed is low and locked. Call bell within reach. Will continue to monitor until volunteer services arrive.   Tobin Chad, RN 05/26/2015 1:52 PM

## 2015-05-29 LAB — CULTURE, BLOOD (ROUTINE X 2)
CULTURE: NO GROWTH
CULTURE: NO GROWTH

## 2015-06-20 DIAGNOSIS — R42 Dizziness and giddiness: Secondary | ICD-10-CM | POA: Insufficient documentation

## 2017-04-19 IMAGING — MR MR HEAD W/O CM
8 of 10 series · 35 of 48 positions shown · non-contrast
Comparison: CT head 05/24/2015

CLINICAL DATA: Dizziness.  Stroke.

EXAM:
MRI HEAD WITHOUT CONTRAST
TECHNIQUE: Multiplanar, multiecho pulse sequences of the brain and surrounding
structures were obtained without intravenous contrast.

[Series 3: DWI · axial · 3.0mm · 1.09mm/px · z∈[-52,+78]mm · 9 of 90 slices shown (1 of 4)]
[im 1/90]
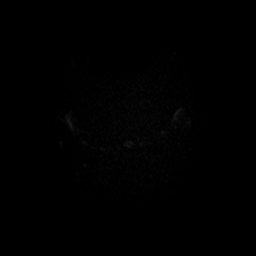
[im 12/90]
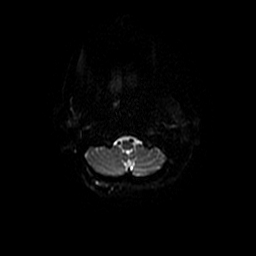
[im 23/90]
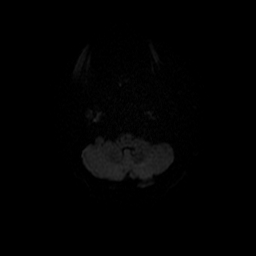
[im 34/90]
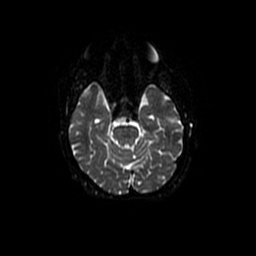
[im 45/90]
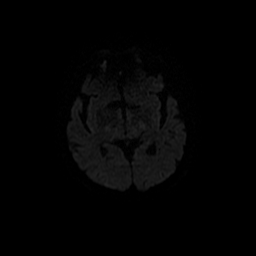
[im 56/90]
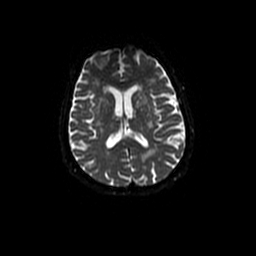
[im 67/90]
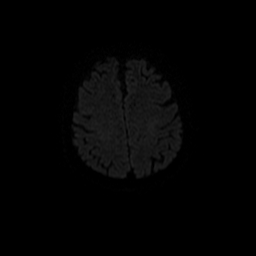
[im 78/90]
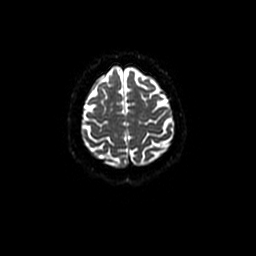
[im 90/90]
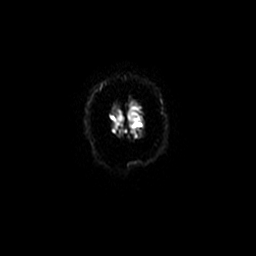

[Series 4: T1 · sagittal · 5.0mm · 0.47mm/px · 3 of 23 slices shown]
[im 1/23]
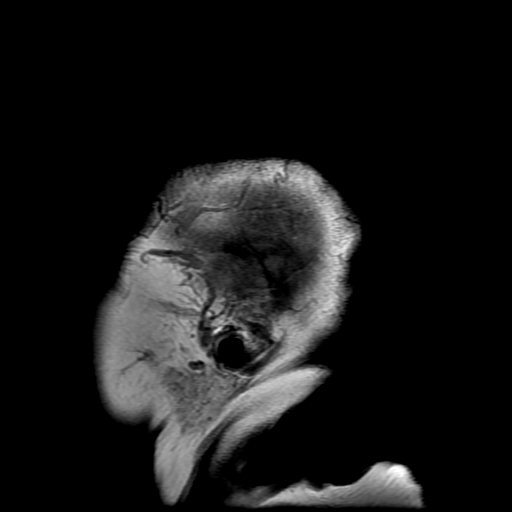
[im 12/23]
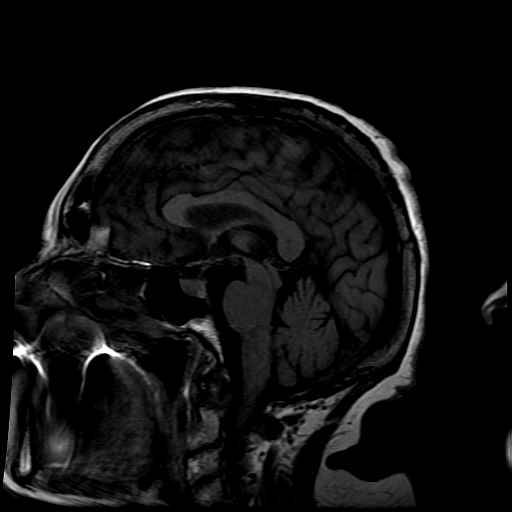
[im 23/23]
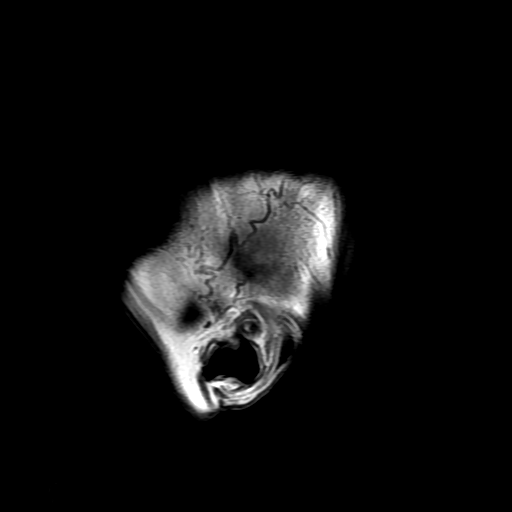

[Series 6: T2 · axial · 5.0mm · 0.43mm/px · z∈[-60,+69]mm · 3 of 23 slices shown (1 of 2)]
[im 1/23]
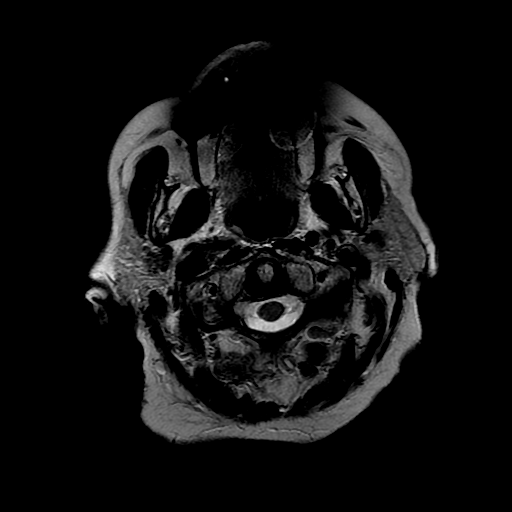
[im 12/23]
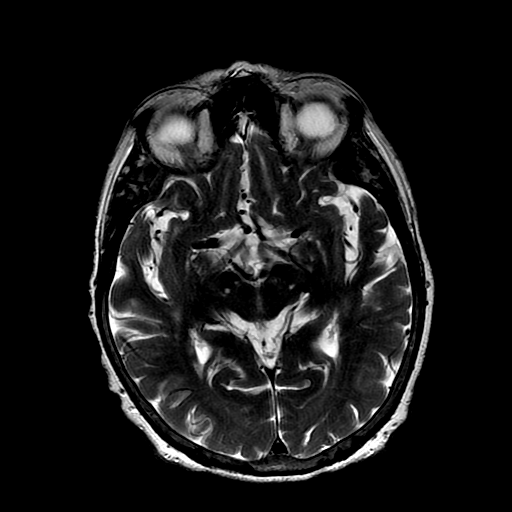
[im 23/23]
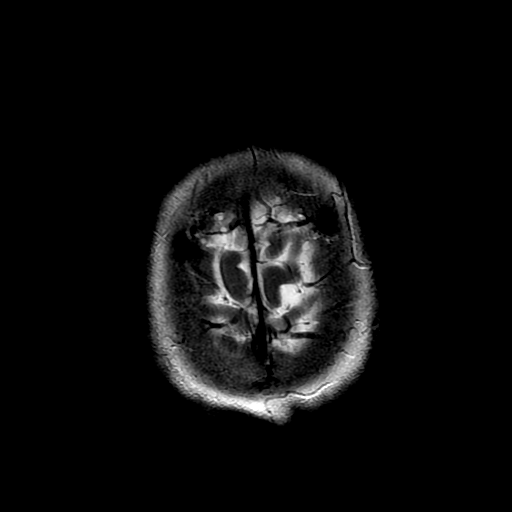

[Series 7: FLAIR · axial · 5.0mm · 0.43mm/px · z∈[-60,+69]mm · 3 of 23 slices shown]
[im 1/23]
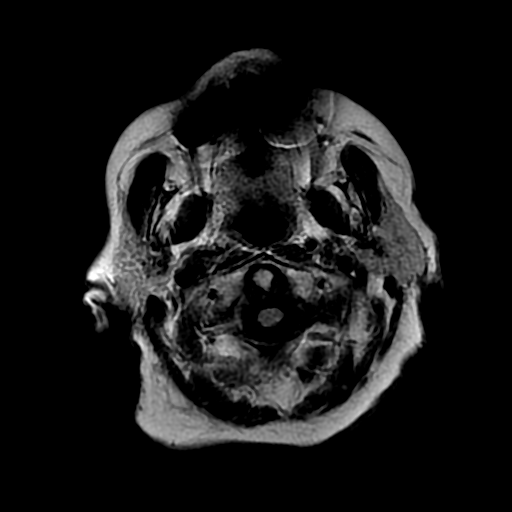
[im 12/23]
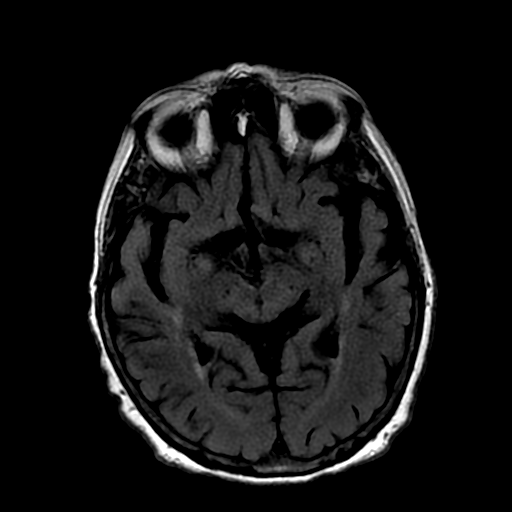
[im 23/23]
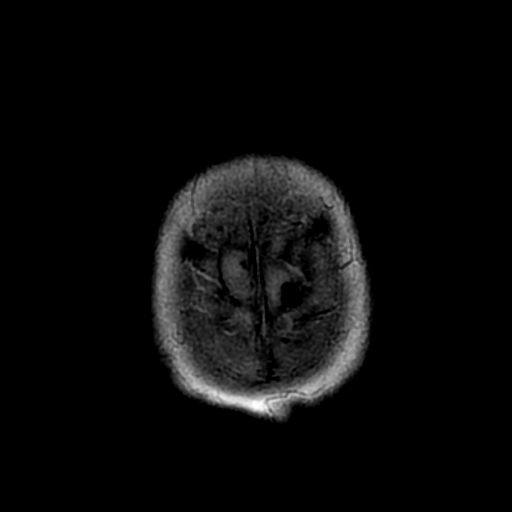

[Series 8: T2 · coronal · 5.0mm · 0.43mm/px · 3 of 24 slices shown (2 of 2)]
[im 1/24]
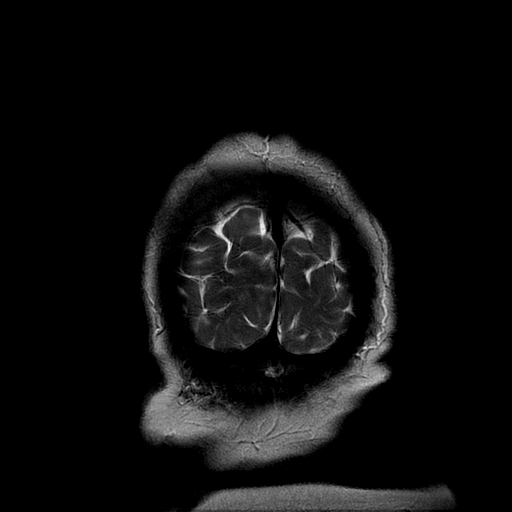
[im 12/24]
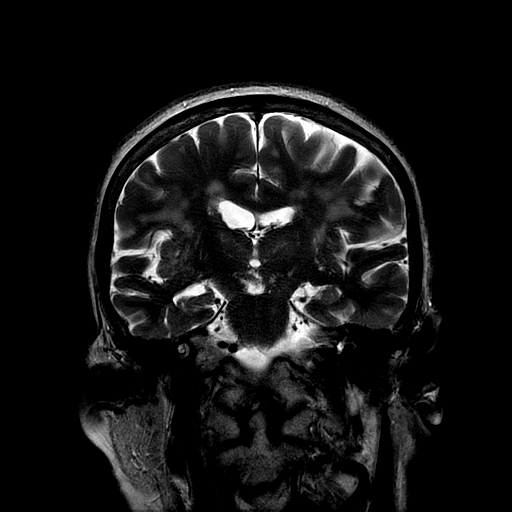
[im 24/24]
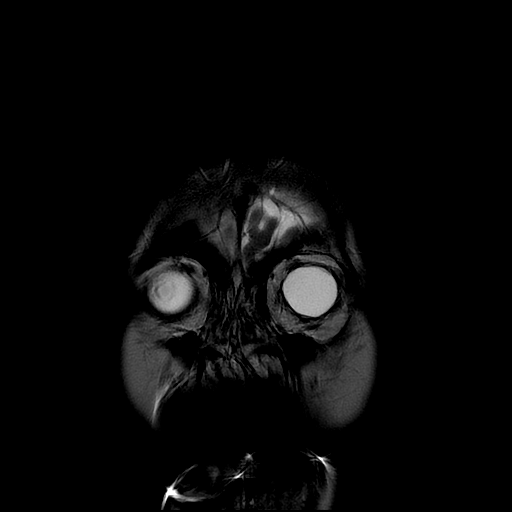

[Series 9: DWI · coronal · 5.0mm · 1.09mm/px · 6 of 58 slices shown (2 of 4)]
[im 1/58]
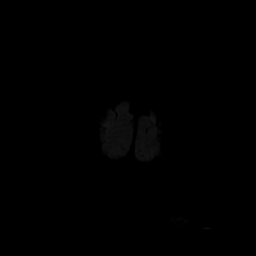
[im 12/58]
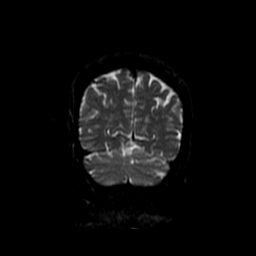
[im 23/58]
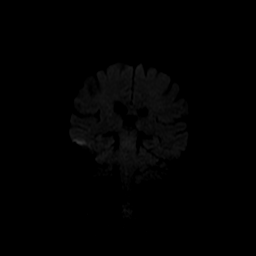
[im 35/58]
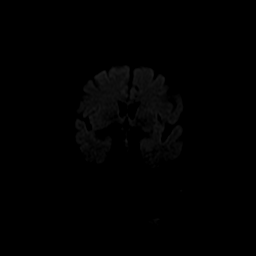
[im 46/58]
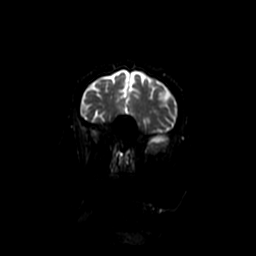
[im 58/58]
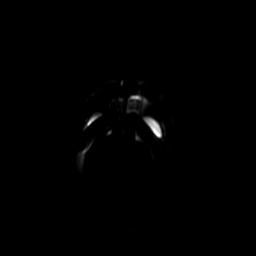

[Series 300: DWI · axial · 3.0mm · 1.09mm/px · z∈[-52,+78]mm · 5 of 45 slices shown (3 of 4)]
[im 1/45]
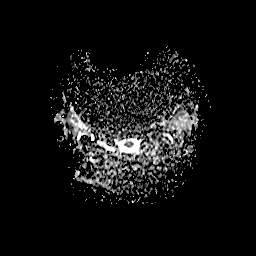
[im 12/45]
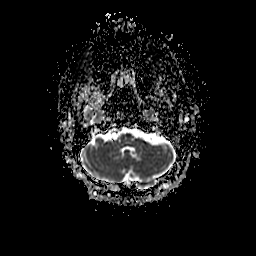
[im 23/45]
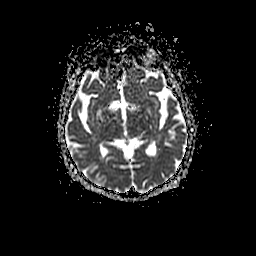
[im 34/45]
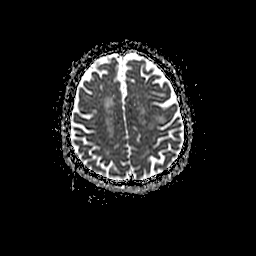
[im 45/45]
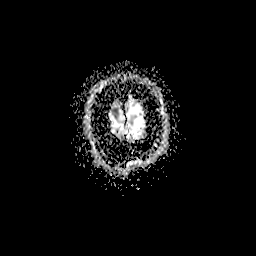

[Series 900: DWI · coronal · 5.0mm · 1.09mm/px · 3 of 29 slices shown (4 of 4)]
[im 1/29]
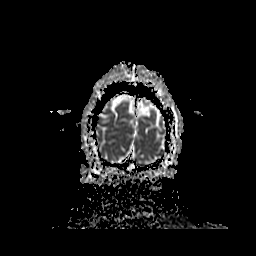
[im 15/29]
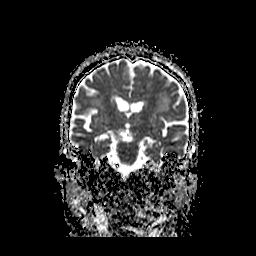
[im 29/29]
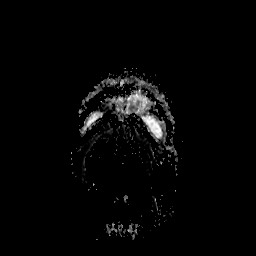

[35 of 48 positions shown; findings below may reference images not displayed]

FINDINGS: Negative for acute infarct.

Moderate chronic microvascular ischemic changes in the white matter
bilaterally. Brainstem intact. Tiny chronic infarcts in the
cerebellum bilaterally.

Negative for intracranial hemorrhage.  Negative for mass or edema.

Cerebral volume normal for age. No hydrocephalus or shift of the
midline structures

Paranasal sinuses clear. Normal orbit bilaterally. Pituitary normal
in size
IMPRESSION: No acute abnormality.

Moderate chronic microvascular ischemic changes in the white matter.

## 2017-04-19 IMAGING — CT CT HEAD W/O CM
1 series · 15 of 30 positions shown, 19 images · non-contrast
Comparison: None.

ADDENDUM:
The original report was by Dr. Mgt Younique. The following
addendum is by Dr. Mgt Younique:

These results were called by telephone at the time of interpretation
on 05/24/2015 at [DATE] to Dr. Dayamy, who verbally acknowledged
these results.
CLINICAL DATA: Right-sided weakness and facial weakness. Slurred
speech.
EXAM:
CT HEAD WITHOUT CONTRAST
TECHNIQUE: Contiguous axial images were obtained from the base of the skull
through the vertex without intravenous contrast.

[Series 2: head 5.0 h30s · axial · 0.40mm/px · z∈[-149,-14]mm · 15 of 30 slices shown, 19 images]
[im 2/30  brain]
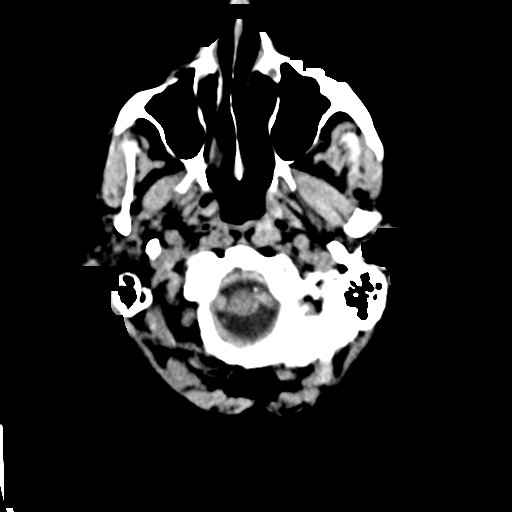
[im 2/30  bone]
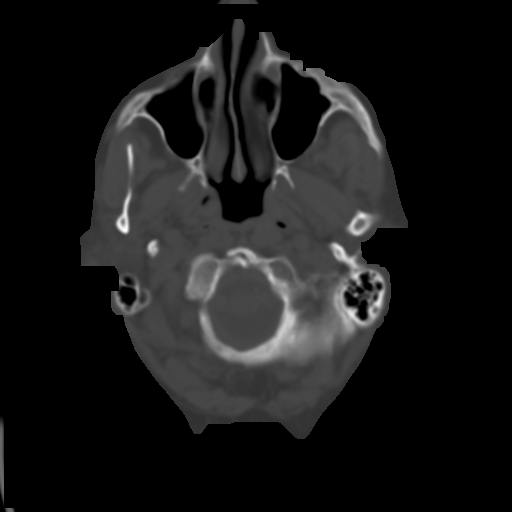
[im 4/30  brain]
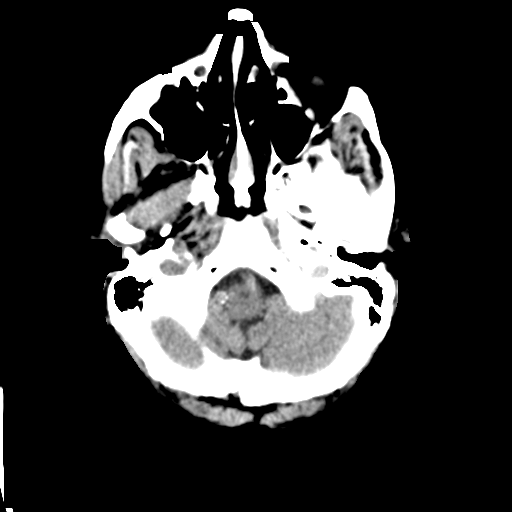
[im 6/30  brain]
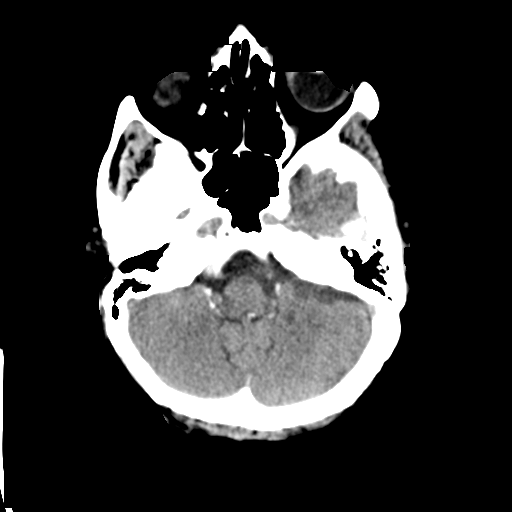
[im 8/30  brain]
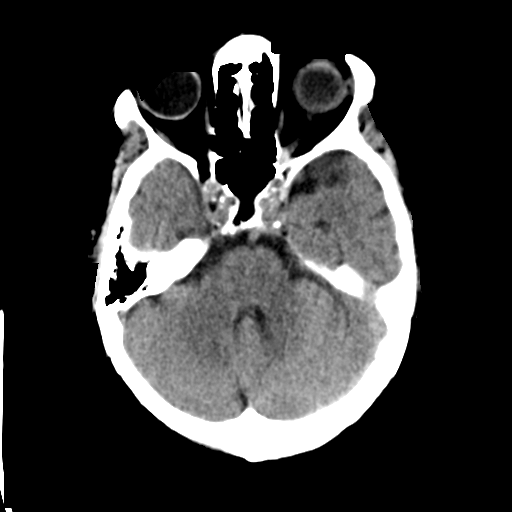
[im 10/30  brain]
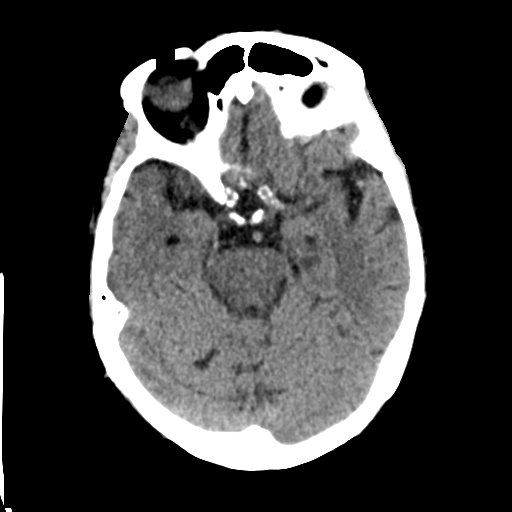
[im 10/30  bone]
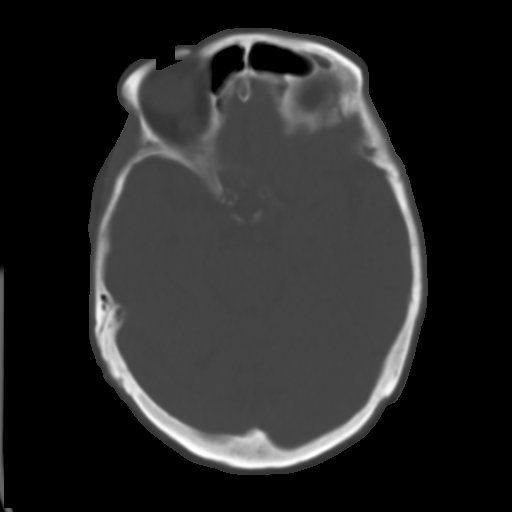
[im 12/30  brain]
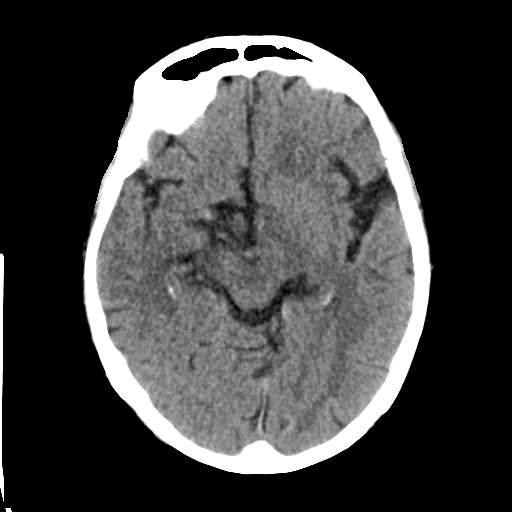
[im 14/30  brain]
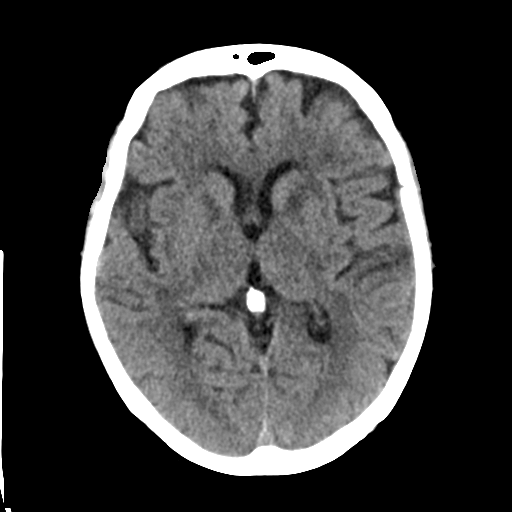
[im 16/30  brain]
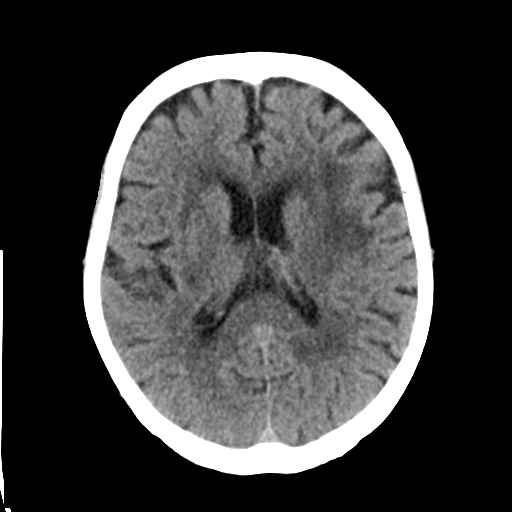
[im 17/30  brain]
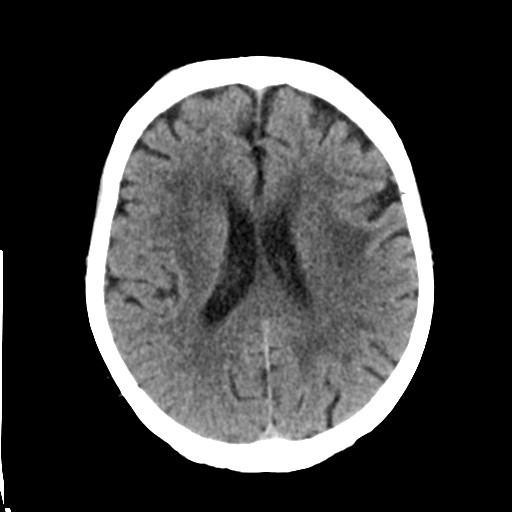
[im 17/30  bone]
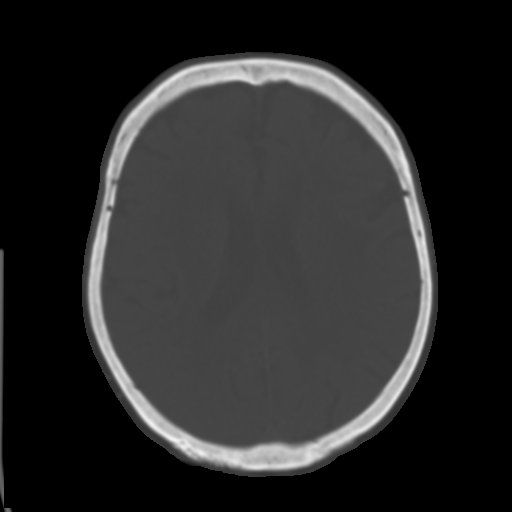
[im 19/30  brain]
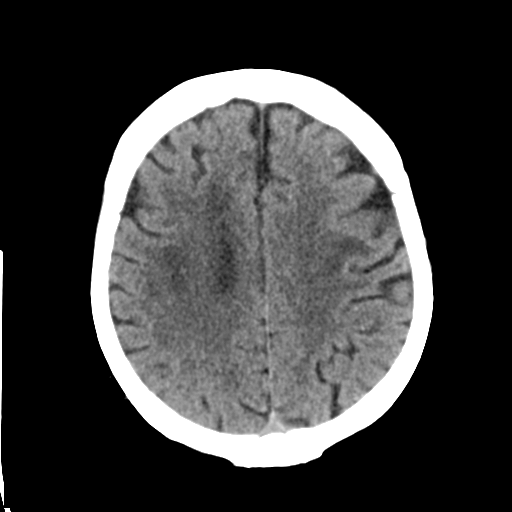
[im 21/30  brain]
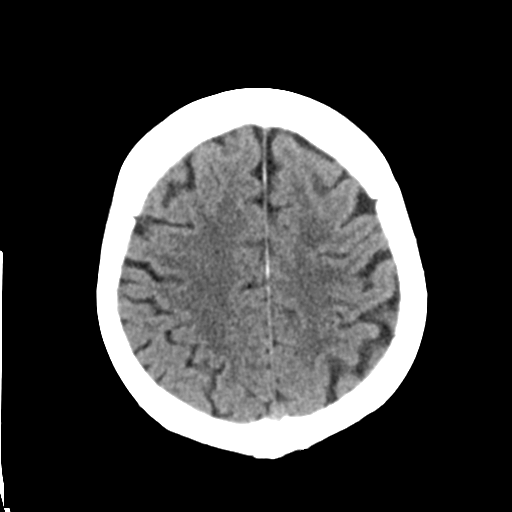
[im 23/30  brain]
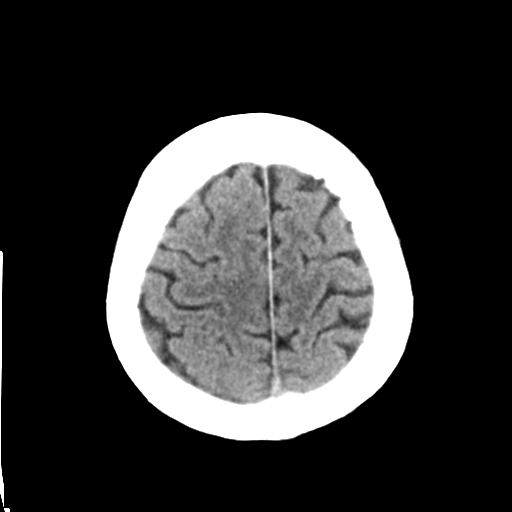
[im 25/30  brain]
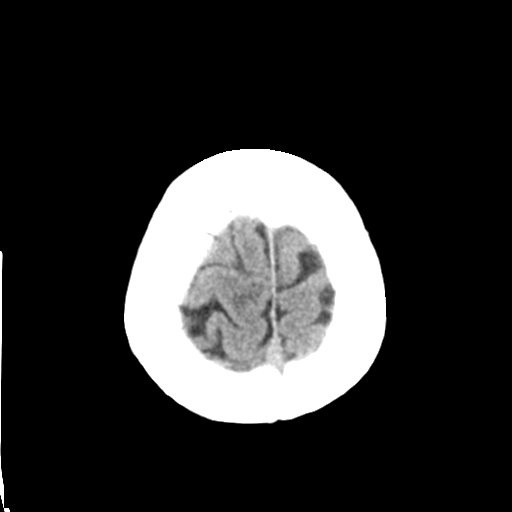
[im 25/30  bone]
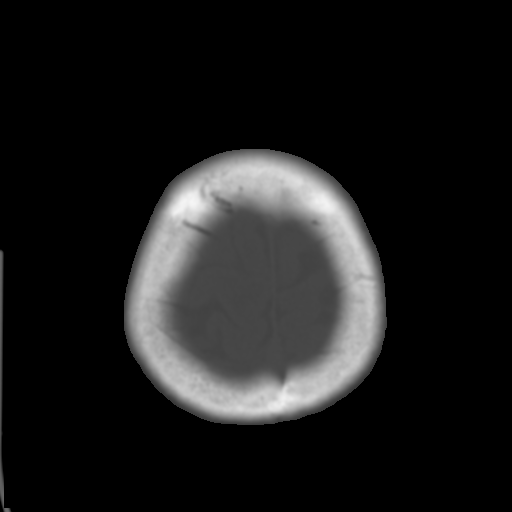
[im 27/30  brain]
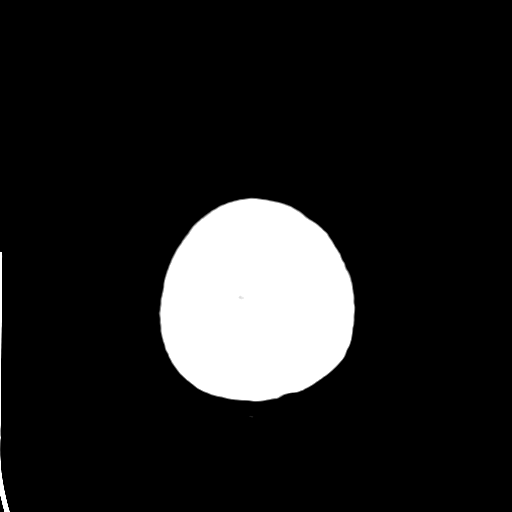
[im 29/30  brain]
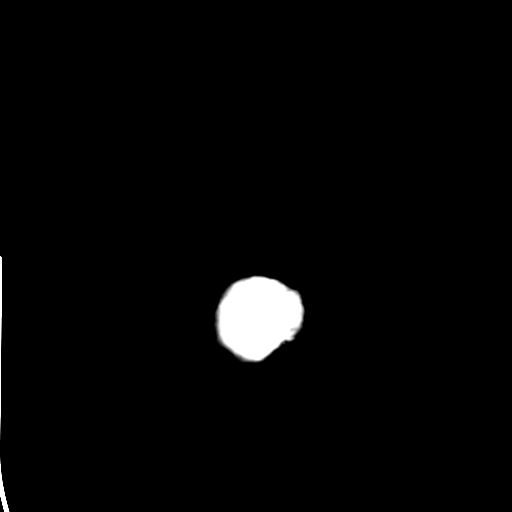

[15 of 30 positions shown; findings below may reference images not displayed]

FINDINGS: 1.0 by 0.5 cm lacunar infarct in the right cerebellum, image 10
series 2, chronic appearing.

Periventricular white matter and corona radiata hypodensities favor
chronic ischemic microvascular white matter disease.

Otherwise, the brainstem, cerebellum, cerebral peduncles, thalami,
basal ganglia, basilar cisterns, and ventricular system appear
within normal limits. No intracranial hemorrhage, mass lesion, or
acute CVA.

Mild chronic ethmoid sinusitis. There is atherosclerotic
calcification of the cavernous carotid arteries bilaterally.
IMPRESSION: 1. No acute intracranial findings are identified.
2. Periventricular white matter and corona radiata hypodensities
favor chronic ischemic microvascular white matter disease.
3. Remote right upper cerebellar lacunar infarct (chronic).
4. Chronic ethmoid sinusitis.
